# Patient Record
Sex: Female | Born: 1990 | Race: White | Hispanic: No | Marital: Single | Smoking: Former smoker
Health system: Southern US, Community
[De-identification: ages and names within clinical notes are randomized; demographics above are authoritative.]

## PROBLEM LIST (undated history)

## (undated) ENCOUNTER — Inpatient Hospital Stay (HOSPITAL_COMMUNITY): Payer: Self-pay

## (undated) DIAGNOSIS — F329 Major depressive disorder, single episode, unspecified: Secondary | ICD-10-CM

## (undated) DIAGNOSIS — R51 Headache: Secondary | ICD-10-CM

## (undated) DIAGNOSIS — B999 Unspecified infectious disease: Secondary | ICD-10-CM

## (undated) DIAGNOSIS — F32A Depression, unspecified: Secondary | ICD-10-CM

## (undated) DIAGNOSIS — F4310 Post-traumatic stress disorder, unspecified: Secondary | ICD-10-CM

## (undated) DIAGNOSIS — D649 Anemia, unspecified: Secondary | ICD-10-CM

## (undated) HISTORY — DX: Post-traumatic stress disorder, unspecified: F43.10

## (undated) HISTORY — PX: TONSILLECTOMY: SUR1361

## (undated) HISTORY — DX: Depression, unspecified: F32.A

## (undated) HISTORY — PX: WISDOM TOOTH EXTRACTION: SHX21

## (undated) HISTORY — DX: Major depressive disorder, single episode, unspecified: F32.9

## (undated) HISTORY — PX: TYMPANOSTOMY TUBE PLACEMENT: SHX32

---

## 2011-09-01 ENCOUNTER — Emergency Department (HOSPITAL_COMMUNITY)
Admission: EM | Admit: 2011-09-01 | Discharge: 2011-09-01 | Disposition: A | Discharge status: Home / Self Care | Payer: Self-pay | Attending: Emergency Medicine | Admitting: Emergency Medicine

## 2011-09-01 ENCOUNTER — Encounter: Payer: Self-pay | Admitting: *Deleted

## 2011-09-01 ENCOUNTER — Emergency Department (HOSPITAL_COMMUNITY): Payer: Self-pay

## 2011-09-01 DIAGNOSIS — M549 Dorsalgia, unspecified: Secondary | ICD-10-CM | POA: Insufficient documentation

## 2011-09-01 DIAGNOSIS — O99891 Other specified diseases and conditions complicating pregnancy: Secondary | ICD-10-CM | POA: Insufficient documentation

## 2011-09-01 DIAGNOSIS — R1032 Left lower quadrant pain: Secondary | ICD-10-CM | POA: Insufficient documentation

## 2011-09-01 DIAGNOSIS — O99340 Other mental disorders complicating pregnancy, unspecified trimester: Secondary | ICD-10-CM | POA: Insufficient documentation

## 2011-09-01 DIAGNOSIS — F329 Major depressive disorder, single episode, unspecified: Secondary | ICD-10-CM

## 2011-09-01 DIAGNOSIS — F3289 Other specified depressive episodes: Secondary | ICD-10-CM | POA: Insufficient documentation

## 2011-09-01 LAB — URINALYSIS, ROUTINE W REFLEX MICROSCOPIC
Hgb urine dipstick: NEGATIVE
Nitrite: NEGATIVE
Specific Gravity, Urine: 1.018 (ref 1.005–1.030)
Urobilinogen, UA: 1 mg/dL (ref 0.0–1.0)
pH: 8 (ref 5.0–8.0)

## 2011-09-01 LAB — CBC
Platelets: 248 10*3/uL (ref 150–400)
RBC: 4.22 MIL/uL (ref 3.87–5.11)
RDW: 12.8 % (ref 11.5–15.5)
WBC: 7.4 10*3/uL (ref 4.0–10.5)

## 2011-09-01 LAB — RAPID URINE DRUG SCREEN, HOSP PERFORMED
Barbiturates: NOT DETECTED
Cocaine: NOT DETECTED
Tetrahydrocannabinol: POSITIVE — AB

## 2011-09-01 LAB — COMPREHENSIVE METABOLIC PANEL
ALT: 11 U/L (ref 0–35)
AST: 13 U/L (ref 0–37)
Alkaline Phosphatase: 43 U/L (ref 39–117)
CO2: 23 mEq/L (ref 19–32)
Chloride: 102 mEq/L (ref 96–112)
GFR calc non Af Amer: >90 mL/min (ref >90)
Potassium: 4.5 mEq/L (ref 3.5–5.1)
Sodium: 134 mEq/L — ABNORMAL LOW (ref 135–145)
Total Bilirubin: 0.3 mg/dL (ref 0.3–1.2)

## 2011-09-01 LAB — POCT PREGNANCY, URINE: Preg Test, Ur: POSITIVE

## 2011-09-01 LAB — DIFFERENTIAL
Basophils Absolute: 0 10*3/uL (ref 0.0–0.1)
Lymphocytes Relative: 23 % (ref 12–46)
Neutro Abs: 5.2 10*3/uL (ref 1.7–7.7)

## 2011-09-01 LAB — URINE MICROSCOPIC-ADD ON

## 2011-09-01 LAB — WET PREP, GENITAL: Trich, Wet Prep: NONE SEEN

## 2011-09-01 MED ORDER — NICOTINE 21 MG/24HR TD PT24: 21.0000 mg | MEDICATED_PATCH | Freq: Every day | TRANSDERMAL | Status: DC

## 2011-09-01 MED ORDER — ACETAMINOPHEN 325 MG PO TABS: 650.0000 mg | ORAL_TABLET | ORAL | Status: DC | PRN

## 2011-09-01 MED ORDER — SODIUM CHLORIDE 0.9 % IV SOLN
Freq: Once | INTRAVENOUS | Status: AC
  Administered 2011-09-01: 125 mL/h via INTRAVENOUS
  Administered 2011-09-01: 125 mL via INTRAVENOUS

## 2011-09-01 MED ORDER — ONDANSETRON HCL 4 MG PO TABS: 4.0000 mg | ORAL_TABLET | Freq: Three times a day (TID) | ORAL | Status: DC | PRN

## 2011-09-01 MED ORDER — ONDANSETRON HCL 4 MG/2ML IJ SOLN
4.0000 mg | INTRAMUSCULAR | Status: DC | PRN
  Administered 2011-09-01: 4 mg via INTRAVENOUS
  Filled 2011-09-01: qty 2

## 2011-09-01 MED ORDER — SODIUM CHLORIDE 0.9 % IV BOLUS (SEPSIS)
1000.0000 mL | Freq: Once | INTRAVENOUS | Status: AC
  Administered 2011-09-01: 1000 mL via INTRAVENOUS

## 2011-09-01 MED ORDER — SODIUM CHLORIDE 0.9 % IV BOLUS (SEPSIS): 1000.0000 mL | Freq: Once | INTRAVENOUS | Status: DC

## 2011-09-01 NOTE — ED Notes (Signed)
Pt received a telepsych consult from Dr. Jacky Kindle which recommended d/c home with outpatient referrals. EDP notified and is in agreement with this disposition. RN made aware. Pt is agreeable with the EDP and psychiatrist's disposition to follow up with outpatient services. CSW reviewed outpatient resources with pt including [facility] and mobile crisis. CSW reviewed the benefits of therapy and pt agreed to follow up with [facility] on 09/02/11. No further CSW needs identified at this time.

## 2011-09-01 NOTE — ED Provider Notes (Signed)
History     CSN: [id number] Arrival date & time: 09/01/2011  8:07 AM   None     Chief Complaint  Patient presents with  . Back Pain  . Abdominal Pain    (Consider location/radiation/quality/duration/timing/severity/associated sxs/prior treatment) HPI  History reviewed. No pertinent past medical history.  Past Surgical History  Procedure Date  . Tonsillectomy     No family history on file.  History  Substance Use Topics  . Smoking status: Former Games developer  . Smokeless tobacco: Not on file  . Alcohol Use: No    OB History    Grav Para Term Preterm Abortions TAB SAB Ect Mult Living                  Review of Systems  Allergies  Review of patient's allergies indicates no known allergies.  Home Medications  No current outpatient prescriptions on file.  BP 115/66  Pulse 99  Temp 98.9 F (37.2 C)  Resp 18  SpO2 98%  LMP 07/13/2011  Physical Exam  ED Course  Procedures (including critical care time)   Labs Reviewed  URINALYSIS, ROUTINE W REFLEX MICROSCOPIC  POCT PREGNANCY, URINE   No results found.   No diagnosis found.    MDM  Not my patient        Doug Sou, MD 09/01/11 [phone]

## 2011-09-01 NOTE — ED Notes (Signed)
Sopcial Worker Elania here and states she has been assessing pt. And discussed her finds with Dr. Marcelyn Bruins. Will be discharged home.

## 2011-09-01 NOTE — ED Notes (Signed)
Expressing hunger but, also states I feel nauseated---Fresh Ginger-ale given to pt.

## 2011-09-01 NOTE — ED Notes (Signed)
Dr. Lynelle Doctor in for pelvic

## 2011-09-01 NOTE — ED Notes (Signed)
C/o left lower quadrant pain radiating into low back--onset two weeks ago with gradual worsening--tearful--states she is about 11 weeks IUP--no bleeding

## 2011-09-01 NOTE — ED Provider Notes (Signed)
6:57 PM Patient has been seen by the patella, psychiatrist. There was no evidence of delusional thought to her cognitive impairment. Patient had no plan or intent to harm herself and they felt she would benefit from outpatient counseling. The patient is amenable to this. They're recommending discharge home.  Teshawn Moan A. Patrica Duel, MD 09/01/11 [id number]

## 2011-09-01 NOTE — ED Provider Notes (Signed)
History     CSN: [id number] Arrival date & time: 09/01/2011  8:07 AM   First MD Initiated Contact with Patient 09/01/11 [phone]      Chief Complaint  Patient presents with  . Back Pain  . Abdominal Pain    (Consider location/radiation/quality/duration/timing/severity/associated sxs/prior treatment) HPI  Patient relates she is G2 P1 Ab0, last normal period the first part of September. She states she is approximately [redacted] weeks pregnant. She states she had a normal first pregnancy. She states she's been seen for back pain and abdominal pain at [facility] about 2 weeks ago. She states they told her "you're stressed out go home and take a nap" she states she's having nausea and vomiting at least 15 times a day with diarrhea at least 15 times a day with loose and watery. She states about 4 days ago she had fever up to 1.4. She has dysuria for pressure feeling after she urinates, she is having frequency, she states she feels dizzy when she sits up or stands up. She denies coughing or sore throat, no vaginal bleeding,. Patient states she has a history of depression and she had postpartum depression after her first child who is 27 months old. She states she was not hospitalized. She states she stopped taking the medicine because it made her angry. Patient states she's under a lot of stress and then burst out crying. She states she's working and her baby's father does not work and she has to support him. States she likes data mental health counselor what she is feeling improved in the ER.  GYN Dr. Dr. Alyce Pagan and [facility]   History reviewed. No pertinent past medical history. Depression Postpartum depression    Past Surgical History  Procedure Date  . Tonsillectomy     No family history on file.  History  Substance Use Topics  . Smoking status: Former Games developer  . Smokeless tobacco: Not on file  . Alcohol Use: No   employed  OB History    Grav Para Term Preterm Abortions TAB SAB  Ect Mult Living                  Review of Systems  All other systems reviewed and are negative.    Allergies  Review of patient's allergies indicates no known allergies.  Home Medications   Current Outpatient Rx  Name Route Sig Dispense Refill  . ACETAMINOPHEN 500 MG PO TABS Oral Take 1,000 mg by mouth every 6 (six) hours as needed. For pain       BP 110/61  Pulse 71  Temp(Src) 98.1 F (36.7 C) (Oral)  Resp 18  SpO2 100%  LMP 07/13/2011 Vital signs normal  Physical Exam  Nursing note and vitals reviewed. Constitutional: She is oriented to person, place, and time. She appears well-developed and well-nourished.  Non-toxic appearance. She does not appear ill. She appears distressed.  HENT:  Head: Normocephalic and atraumatic.  Right Ear: External ear normal.  Left Ear: External ear normal.  Nose: Nose normal. No mucosal edema or rhinorrhea.  Mouth/Throat: Mucous membranes are normal. No dental abscesses or uvula swelling.       Mucous membranes are dry  Eyes: Conjunctivae and EOM are normal. Pupils are equal, round, and reactive to light.       Cries with tears  Neck: Normal range of motion and full passive range of motion without pain. Neck supple.  Cardiovascular: Normal rate, regular rhythm and normal heart sounds.  Exam  reveals no gallop and no friction rub.   No murmur heard. Pulmonary/Chest: Effort normal and breath sounds normal. No respiratory distress. She has no wheezes. She has no rhonchi. She has no rales. She exhibits no tenderness and no crepitus.  Abdominal: Soft. Normal appearance and bowel sounds are normal. She exhibits no distension. There is no tenderness. There is no rebound and no guarding.  Genitourinary: Vaginal discharge found.       Patient has normal external genitalia. She is a lot of thick white discharge in her vault. Her uterus does not feel very enlarged. Her left adnexa is tender without mass., Her right adnexa is nontender. Her uterus is  nontender no blood was seen  Musculoskeletal: Normal range of motion. She exhibits no edema and no tenderness.       Moves all extremities well.   Neurological: She is alert and oriented to person, place, and time. She has normal strength. No cranial nerve deficit.  Skin: Skin is warm, dry and intact. No rash noted. No erythema. No pallor.  Psychiatric: Her speech is normal and behavior is normal. Her mood appears not anxious.       Patient is very flat affect she is crying during her exam.    ED Course Patient  is receiving IV fluids, IV Zofran and oral Imodium for vomiting or diarrhea. Pt is still tearful at times and wants to have a behavioral health assessment.  13:48 [place], ACT will come evaluate patient.   14:45 patient requesting to eat per nursing staff, will try oral fluids.  And crackers. Pt states she hasn't had any diarrhea since she has been in the ED, she states she has vomited twice.   [place] bedside Date/Time: 09/01/2011 10:33 AM Performed by: Lynelle Doctor, Ashya Nicolaisen L Authorized by: Ward Givens Consent: Verbal consent obtained. Consent given by: patient Patient understanding: patient states understanding of the procedure being performed Patient identity confirmed: verbally with patient, arm band, provided demographic data and hospital-assigned identification number Time out: Immediately prior to procedure a "time out" was called to verify the correct patient, procedure, equipment, support staff and site/side marked as required. Local anesthesia used: no Patient sedated: no Patient tolerance: Patient tolerated the procedure well with no immediate complications. Comments: Pt has IUP approximately [redacted] weeks gestation with FHR of 162.  Views archieved When I did her ultrasound patient is now smiling and laughing.   (including critical care time)   Results for orders placed during the hospital encounter of 09/01/11  URINALYSIS, ROUTINE W REFLEX MICROSCOPIC      Component Value Range     Color, Urine YELLOW  YELLOW    APPearance CLOUDY (*) CLEAR    Specific Gravity, Urine 1.018  1.005 - 1.030    pH 8.0  5.0 - 8.0    Glucose, UA NEGATIVE  NEGATIVE (mg/dL)   Hgb urine dipstick NEGATIVE  NEGATIVE    Bilirubin Urine NEGATIVE  NEGATIVE    Ketones, ur 15 (*) NEGATIVE (mg/dL)   Protein, ur NEGATIVE  NEGATIVE (mg/dL)   Urobilinogen, UA 1.0  0.0 - 1.0 (mg/dL)   Nitrite NEGATIVE  NEGATIVE    Leukocytes, UA MODERATE (*) NEGATIVE   POCT PREGNANCY, URINE      Component Value Range   Preg Test, Ur POSITIVE    CBC      Component Value Range   WBC 7.4  4.0 - 10.5 (K/uL)   RBC 4.22  3.87 - 5.11 (MIL/uL)   Hemoglobin 13.2  12.0 -  15.0 (g/dL)   HCT 16.1  09.6 - 04.5 (%)   MCV 87.7  78.0 - 100.0 (fL)   MCH 31.3  26.0 - 34.0 (pg)   MCHC 35.7  30.0 - 36.0 (g/dL)   RDW 40.9  81.1 - 91.4 (%)   Platelets 248  150 - 400 (K/uL)  DIFFERENTIAL      Component Value Range   Neutrophils Relative 70  43 - 77 (%)   Neutro Abs 5.2  1.7 - 7.7 (K/uL)   Lymphocytes Relative 23  12 - 46 (%)   Lymphs Abs 1.7  0.7 - 4.0 (K/uL)   Monocytes Relative 6  3 - 12 (%)   Monocytes Absolute 0.4  0.1 - 1.0 (K/uL)   Eosinophils Relative 1  0 - 5 (%)   Eosinophils Absolute 0.1  0.0 - 0.7 (K/uL)   Basophils Relative 0  0 - 1 (%)   Basophils Absolute 0.0  0.0 - 0.1 (K/uL)  COMPREHENSIVE METABOLIC PANEL      Component Value Range   Sodium 134 (*) 135 - 145 (mEq/L)   Potassium 4.5  3.5 - 5.1 (mEq/L)   Chloride 102  96 - 112 (mEq/L)   CO2 23  19 - 32 (mEq/L)   Glucose, Bld 83  70 - 99 (mg/dL)   BUN 6  6 - 23 (mg/dL)   Creatinine, Ser 7.82 (*) 0.50 - 1.10 (mg/dL)   Calcium 9.3  8.4 - 95.6 (mg/dL)   Total Protein 7.2  6.0 - 8.3 (g/dL)   Albumin 3.7  3.5 - 5.2 (g/dL)   AST 13  0 - 37 (U/L)   ALT 11  0 - 35 (U/L)   Alkaline Phosphatase 43  39 - 117 (U/L)   Total Bilirubin 0.3  0.3 - 1.2 (mg/dL)   GFR calc non Af Amer >90  >90 (mL/min)   GFR calc Af Amer >90  >90 (mL/min)  HCG, QUANTITATIVE, PREGNANCY       Component Value Range   hCG, Beta Chain, Quant, S 21308 (*) <5 (mIU/mL)  WET PREP, GENITAL      Component Value Range   Yeast, Wet Prep NONE SEEN  NONE SEEN    Trich, Wet Prep NONE SEEN  NONE SEEN    Clue Cells, Wet Prep FEW (*) NONE SEEN    WBC, Wet Prep HPF POC MANY (*) NONE SEEN   URINE MICROSCOPIC-ADD ON      Component Value Range   Squamous Epithelial / LPF FEW (*) RARE    WBC, UA 7-10  <3 (WBC/hpf)   Bacteria, UA FEW (*) RARE    Urine-Other MUCOUS PRESENT    ETHANOL      Component Value Range   Alcohol, Ethyl (B) <11  0 - 11 (mg/dL)    Laboratory Interpretation possible UTI, bacterial vaginosis  US Ob Comp Less 14 Wks  09/01/2011  *RADIOLOGY REPORT*  Clinical Data: Left lower quadrant pain.  OBSTETRIC <14 WK [place] AND TRANSVAGINAL OB US  Technique:  Both transabdominal and transvaginal ultrasound examinations were performed for complete evaluation of the gestation as well as the maternal uterus, adnexal regions, and pelvic cul-de-sac.  Transvaginal technique was performed to assess early pregnancy.  Comparison:  None.  Intrauterine gestational sac:  Visualized/normal in shape. Yolk sac: Visualized Embryo: Visualized Cardiac Activity: Visualized Heart Rate: 165 bpm  MSD:   mm      w     d CRL: 55.3   mm  12  w  1   d             [place] EDC: 03/14/2012  Maternal uterus/adnexae: The left ovary appears normal measuring 1.5 x 2.2 x 1.9 cm.  The right ovary appears normal measuring 1.8 x 2.2 x 2.9 cm and containing a corpus luteum.  No pelvic fluid or separate adnexal masses are seen.  IMPRESSION: Single living intrauterine pregnancy demonstrating an estimated gestational age by crown-rump length of 12 weeks 1 day.  This is 1 week behind expected estimated gestational age by LMP of 13 weeks 1 day.  Normal ovaries.  Original Report Authenticated By: Bertha Stakes, M.D.   US Ob Transvaginal  09/01/2011  *RADIOLOGY REPORT*  Clinical Data: Left lower quadrant pain.  OBSTETRIC <14 WK [place] AND  TRANSVAGINAL OB US  Technique:  Both transabdominal and transvaginal ultrasound examinations were performed for complete evaluation of the gestation as well as the maternal uterus, adnexal regions, and pelvic cul-de-sac.  Transvaginal technique was performed to assess early pregnancy.  Comparison:  None.  Intrauterine gestational sac:  Visualized/normal in shape. Yolk sac: Visualized Embryo: Visualized Cardiac Activity: Visualized Heart Rate: 165 bpm  MSD:   mm      w     d CRL: 55.3   mm  12   w  1   d             [place] EDC: 03/14/2012  Maternal uterus/adnexae: The left ovary appears normal measuring 1.5 x 2.2 x 1.9 cm.  The right ovary appears normal measuring 1.8 x 2.2 x 2.9 cm and containing a corpus luteum.  No pelvic fluid or separate adnexal masses are seen.  IMPRESSION: Single living intrauterine pregnancy demonstrating an estimated gestational age by crown-rump length of 12 weeks 1 day.  This is 1 week behind expected estimated gestational age by LMP of 13 weeks 1 day.  Normal ovaries.  Original Report Authenticated By: Bertha Stakes, M.D.    Diagnoses that have been ruled out:  Diagnoses that are still under consideration:  Final diagnoses:  Depression  Pregnancy  Disposition per Dr Denton Brick after ACT evaluation  Devoria Albe, MD, FACEP        MDM          Ward Givens, MD 09/01/11 2029

## 2011-09-01 NOTE — ED Notes (Signed)
Social Worker in to see pt.

## 2011-09-01 NOTE — ED Notes (Signed)
Information has been faxed for a psych-consult needed for further disposition recommendations.

## 2011-09-01 NOTE — BH Assessment (Signed)
Assessment Note   Ana Mejia is an 20 y.o. female. Patient represented to the ED with feelings and reports of stress and exhaustion.   Patient states she has a 21 year old and is pregnant again (11 weeks). Patient reports being stressed from working over 70 hours per week and constant battles with her probation officer who patient feels does not want her to be successful. Patient states she was in the hospital a few months ago, and her probation officer took warrant out for her arrest, when she was discharge, despite the fact she had documentation showing inpatient treatment.    Patient states her employer is very supported, despite her criminal charges, and wanted to send her back to school and pay for it.  Patient states her probation officer blocked this opportunity, stating that patient could not because she is on curfew from 7pm - 7am.  Patient also reports that she cannot leave [facility], her mother lives in [place] and she has no support system here.  Despite that, she does not have a positive relationship with her mother who she reports does not have a positive lifestyle.   Her boyfriend and the father of her daughter and pending child is 32 yrs old, does not work and does not wish to work, and she refuses to allow him to stay at her home. Patient states that she will not take care of an adult female.  Patient reports having 2 1/2 years left on probation. Patient denies SI/HI/AVH, however remained very tearful during assessment. Patient unable to respond to "magical question" (What would make everything good).  This Clinical research associate has requested psychiatrist to evaluate patient for disposition.  Axis I: Depressive Disorder NOS Axis II: Deferred Axis III: History reviewed. No pertinent past medical history. Axis IV: problems related to legal system/crime, problems with access to health care services and problems with primary support group Axis V: 51-60 moderate symptoms  Past Medical  History: History reviewed. No pertinent past medical history.  Past Surgical History  Procedure Date  . Tonsillectomy     Family History: No family history on file.  Social History:  reports that she has quit smoking. She does not have any smokeless tobacco history on file. She reports that she does not drink alcohol or use illicit drugs.  Additional Social History:    Allergies: No Known Allergies  Home Medications:  Medications Prior to Admission  Medication Dose Route Frequency Provider Last Rate Last Dose  . 0.9 %  sodium chloride infusion   Intravenous Once Ward Givens, MD 125 mL/hr at 09/01/11 1107 125 mL at 09/01/11 1107  . ondansetron (ZOFRAN) injection 4 mg  4 mg Intravenous Q30 min PRN Ward Givens, MD   4 mg at 09/01/11 1108  . sodium chloride 0.9 % bolus 1,000 mL  1,000 mL Intravenous Once Ward Givens, MD   1,000 mL at 09/01/11 1108   No current outpatient prescriptions on file as of 09/01/2011.    OB/GYN Status:  Patient's last menstrual period was 07/13/2011.  General Assessment Data Assessment Number: 1  Living Arrangements: Children Can pt return to current living arrangement?: Yes Admission Status: Voluntary Is patient capable of signing voluntary admission?: Yes Transfer from: Acute Hospital Referral Source: Self/Family/Friend     Risk to self Suicidal Ideation: No Suicidal Intent: No Is patient at risk for suicide?: No Suicidal Plan?: No Access to Means: No What has been your use of drugs/alcohol within the last 12 months?: None  Previous Attempts/Gestures: No Intentional Self Injurious Behavior: None Family Suicide History: No Recent stressful life event(s): Legal Issues Persecutory voices/beliefs?: No Depression: Yes Depression Symptoms: Tearfulness;Loss of interest in usual pleasures;Isolating Substance abuse history and/or treatment for substance abuse?: No Suicide prevention information given to non-admitted patients: Not applicable  Risk to  Others Homicidal Ideation: No Thoughts of Harm to Others: No Current Homicidal Intent: No Current Homicidal Plan: No Access to Homicidal Means: No History of harm to others?: No Assessment of Violence: None Noted Does patient have access to weapons?: No Criminal Charges Pending?: No Does patient have a court date: No  Psychosis Hallucinations: None noted Delusions: None noted  Mental Status Report Appear/Hygiene: Improved Eye Contact: Fair Motor Activity: Unremarkable Speech: Logical/coherent Level of Consciousness: Crying;Alert Mood: Depressed;Ashamed/humiliated Affect: Sad;Depressed Anxiety Level: Moderate Thought Processes: Coherent;Tangential Judgement: Unimpaired Orientation: Person;Place;Time;Situation Obsessive Compulsive Thoughts/Behaviors: Minimal  Cognitive Functioning Concentration: Decreased Memory: Recent Intact;Remote Intact IQ: Average Insight: Fair Impulse Control: Fair Appetite: Poor Weight Loss: 5  Sleep: Decreased Total Hours of Sleep: 4  Vegetative Symptoms: None  Prior Inpatient Therapy Prior Inpatient Therapy: No  Prior Outpatient Therapy Prior Outpatient Therapy: Yes Prior Therapy Dates: unknown Prior Therapy Facilty/Provider(s): Unknown Reason for Treatment: Post Partum Depression            Values / Beliefs Cultural Requests During Hospitalization: None Spiritual Requests During Hospitalization: None        Additional Information 1:1 In Past 12 Months?: No CIRT Risk: No Elopement Risk: No Does patient have medical clearance?: Yes     Disposition:  Disposition Disposition of Patient: Other dispositions (Pending) Other disposition(s): Other (Comment)  On Site Evaluation by:   Reviewed with Physician:     Ileene Hutchinson 09/01/2011 2:39 PM

## 2011-09-01 NOTE — ED Notes (Signed)
Pt reports being approx [redacted] weeks pregnant. Reports generalized body aches, diarrhea, LLQ abd pain, dizziness. Denies vaginal bleeding.

## 2011-09-02 LAB — GC/CHLAMYDIA PROBE AMP, GENITAL
Chlamydia, DNA Probe: NEGATIVE
GC Probe Amp, Genital: NEGATIVE

## 2011-09-27 NOTE — L&D Delivery Note (Signed)
Delivery Note At 2:36 AM a viable and healthy female was delivered via Vaginal, Spontaneous Delivery (Presentation: ROA ).  APGAR 8,9: pending; Meconium stained amniotic fluid, infant vigorous at birth, placed on mother's abdomen at delivery; weight pending.   Placenta status: Intact, Spontaneous.  Cord: 3 vessels with the following complications: None.    Anesthesia: Epidural  Episiotomy: None Lacerations: None Suture Repair: N/A Est. Blood Loss (mL): 250  Mom to postpartum.  Baby to nursery-stable.  LEFTWICH-KIRBY, Tequisha Maahs 03/24/2012, 2:52 AM

## 2011-09-27 NOTE — L&D Delivery Note (Signed)
I have seen @PTNAME @ and examination done.  I agree with documentation and plan as noted in resident/PA's note. Silviano Neuser V 03/31/2012 5:57 AM

## 2011-11-24 ENCOUNTER — Inpatient Hospital Stay (HOSPITAL_COMMUNITY): Payer: Self-pay

## 2011-11-24 ENCOUNTER — Encounter (HOSPITAL_COMMUNITY): Payer: Self-pay

## 2011-11-24 ENCOUNTER — Inpatient Hospital Stay (HOSPITAL_COMMUNITY)
Admission: AD | Admit: 2011-11-24 | Discharge: 2011-11-24 | Disposition: A | Discharge status: Home / Self Care | Payer: Self-pay | Source: Ambulatory Visit | Attending: Obstetrics & Gynecology | Admitting: Obstetrics & Gynecology

## 2011-11-24 DIAGNOSIS — R109 Unspecified abdominal pain: Secondary | ICD-10-CM | POA: Insufficient documentation

## 2011-11-24 DIAGNOSIS — O358XX0 Maternal care for other (suspected) fetal abnormality and damage, not applicable or unspecified: Secondary | ICD-10-CM | POA: Insufficient documentation

## 2011-11-24 DIAGNOSIS — O99891 Other specified diseases and conditions complicating pregnancy: Secondary | ICD-10-CM | POA: Insufficient documentation

## 2011-11-24 DIAGNOSIS — K219 Gastro-esophageal reflux disease without esophagitis: Secondary | ICD-10-CM

## 2011-11-24 DIAGNOSIS — N949 Unspecified condition associated with female genital organs and menstrual cycle: Secondary | ICD-10-CM

## 2011-11-24 DIAGNOSIS — Z3480 Encounter for supervision of other normal pregnancy, unspecified trimester: Secondary | ICD-10-CM

## 2011-11-24 HISTORY — DX: Headache: R51

## 2011-11-24 LAB — WET PREP, GENITAL
Clue Cells Wet Prep HPF POC: NONE SEEN
Trich, Wet Prep: NONE SEEN

## 2011-11-24 LAB — DIFFERENTIAL
Basophils Absolute: 0 10*3/uL (ref 0.0–0.1)
Basophils Relative: 0 % (ref 0–1)
Lymphocytes Relative: 19 % (ref 12–46)
Monocytes Relative: 5 % (ref 3–12)
Neutro Abs: 8.2 10*3/uL — ABNORMAL HIGH (ref 1.7–7.7)
Neutrophils Relative %: 76 % (ref 43–77)

## 2011-11-24 LAB — URINALYSIS, ROUTINE W REFLEX MICROSCOPIC
Glucose, UA: NEGATIVE mg/dL
Ketones, ur: 15 mg/dL — AB
Leukocytes, UA: NEGATIVE
Nitrite: NEGATIVE
Specific Gravity, Urine: 1.015 (ref 1.005–1.030)
pH: 7 (ref 5.0–8.0)

## 2011-11-24 LAB — CBC
Hemoglobin: 12.5 g/dL (ref 12.0–15.0)
MCHC: 34.5 g/dL (ref 30.0–36.0)
WBC: 10.8 10*3/uL — ABNORMAL HIGH (ref 4.0–10.5)

## 2011-11-24 MED ORDER — IBUPROFEN 600 MG PO TABS
600.0000 mg | ORAL_TABLET | Freq: Once | ORAL | Status: AC
  Administered 2011-11-24: 600 mg via ORAL
  Filled 2011-11-24: qty 1

## 2011-11-24 MED ORDER — PROMETHAZINE HCL 12.5 MG PO TABS: 25.0000 mg | ORAL_TABLET | Freq: Four times a day (QID) | ORAL | Status: DC | PRN

## 2011-11-24 MED ORDER — ONDANSETRON HCL 8 MG PO TABS: 8.0000 mg | ORAL_TABLET | Freq: Three times a day (TID) | ORAL | Status: AC | PRN

## 2011-11-24 NOTE — Discharge Instructions (Signed)
Prenatal Care [facility] OB/GYN    [facility] OB/GYN  & Infertility  [phone]     Phone: [phone]          [facility]                      [facility]  @[place]     Phone: [phone]  Phone: [phone]         [facility]     Phone: [phone]  Phone: [phone]           [facility] @ [place]                hone: [phone]  Phone: [phone]         [facility] Dr. Francoise Ceo      Phone: [phone]  Phone: [phone]         [facility]                Phone: [phone]  [facility]   Phone:[phone]    Family [address] [place])          Phone: [phone] [facility]   Phone: [phone] Diet for GERD or PUD Nutrition therapy can help ease the discomfort of gastroesophageal reflux disease (GERD) and peptic ulcer disease (PUD).  HOME CARE INSTRUCTIONS   Eat your meals slowly, in a relaxed setting.   Eat 5 to 6 small meals per day.   If a food causes distress, stop eating it for a period of time.  FOODS TO AVOID  Coffee, regular or decaffeinated.   Cola beverages, regular or low calorie.   Tea, regular or decaffeinated.   Pepper.   Cocoa.   High fat foods, including meats.   Butter, margarine, hydrogenated oil (trans fats).   Peppermint or spearmint (if you have GERD).   Fruits and vegetables if not tolerated.   Alcohol.   Nicotine (smoking or chewing). This is one of the most potent stimulants to acid production in the gastrointestinal tract.   Any food that seems to aggravate your condition.  If you have questions regarding your diet, ask your caregiver or a registered dietitian. TIPS  Lying flat may make symptoms worse. Keep the head of your bed raised 6 to 9 inches (15 to 23 cm) by using a foam wedge or blocks under the legs of the bed.     Do not lay down until 3 hours after eating a meal.   Daily physical activity may help reduce symptoms.  MAKE SURE YOU:   Understand these instructions.   Will watch your condition.   Will get help right away if you are not doing well or get worse.  Document Released: 09/12/2005 Document Revised: 05/25/2011 Document Reviewed: 01/26/2009 [facility] Patient Information 2012 [place], [place].Diet for GERD or PUD Nutrition therapy can help ease the discomfort of gastroesophageal reflux disease (GERD) and peptic ulcer disease (PUD).  HOME CARE INSTRUCTIONS   Eat your meals slowly, in a relaxed setting.   Eat 5 to 6 small meals per day.   If a food causes distress, stop eating it for a period of time.  FOODS TO AVOID  Coffee, regular or decaffeinated.   Cola beverages, regular or low calorie.   Tea, regular or decaffeinated.   Pepper.   Cocoa.   High fat foods, including meats.  Butter, margarine, hydrogenated oil (trans fats).   Peppermint or spearmint (if you have GERD).   Fruits and vegetables if not tolerated.   Alcohol.   Nicotine (smoking or chewing). This is one of the most potent stimulants to acid production in the gastrointestinal tract.   Any food that seems to aggravate your condition.  If you have questions regarding your diet, ask your caregiver or a registered dietitian. TIPS  Lying flat may make symptoms worse. Keep the head of your bed raised 6 to 9 inches (15 to 23 cm) by using a foam wedge or blocks under the legs of the bed.   Do not lay down until 3 hours after eating a meal.   Daily physical activity may help reduce symptoms.  MAKE SURE YOU:   Understand these instructions.   Will watch your condition.   Will get help right away if you are not doing well or get worse.  Document Released: 09/12/2005 Document Revised: 05/25/2011 Document Reviewed: 01/26/2009 [facility] Patient Information 2012 [place], [place].

## 2011-11-24 NOTE — Progress Notes (Signed)
Patient states she had been told by [facility] that she had an ectopic pregnancy on 11-8. Patient states she has had no prenatal care and started having lower abdominal pain last night that feels like contractions. Had a little spotting last night but none today. Patient states she has not felt any fetal movement.

## 2011-11-24 NOTE — ED Provider Notes (Signed)
History     Chief Complaint  Patient presents with  . Abdominal Pain   HPI Ana Mejia is 21 y.o. G2P1001 Unknown weeks presenting with abdominal pain, associated with nausea and vomiting that began last night. She has had several episodes of diarrhea today.  Has not felt well the last week.  Occ heartburn.  Chill all night and day, shakey.  Denies fever.   Was seen early in the pregnancy at [facility] and thought they told her it was an ectopic pregnancy, had followup.  When I asked if the u/s saw anything, she said no..  Ectopic precautions were given at time of discharge per her report.  Was seeing Dr. Rodell Perna in [facility] but lost insurance so they will not see her per her report.  Has not had prenatal care since December.   Medicaid pending.  Plans prenatal care in [facility].      Past Medical History  Diagnosis Date  . Headache     Past Surgical History  Procedure Date  . Tonsillectomy   . Wisdom tooth extraction   . Tympanostomy tube placement     x3    No family history on file.  History  Substance Use Topics  . Smoking status: Former Games developer  . Smokeless tobacco: Not on file  . Alcohol Use: No    Allergies: No Known Allergies  Prescriptions prior to admission  Medication Sig Dispense Refill  . acetaminophen (TYLENOL) 500 MG tablet Take 1,000 mg by mouth every 6 (six) hours as needed. For pain         ROS: Otherwise neg Physical Exam   Blood pressure 118/79, pulse 72, temperature 97.3 F (36.3 C), temperature source Oral, resp. rate 20, height 5\' 4"  (1.626 m), weight 73.211 kg (161 lb 6.4 oz), last menstrual period 07/13/2011, SpO2 99.00%.  Physical Exam  Constitutional: She is oriented to person, place, and time. She appears well-developed and well-nourished.  HENT:  Head: Normocephalic.  Cardiovascular: Normal rate.   Respiratory: Effort normal.  GI: There is no rebound and no guarding.       gravid  Genitourinary: Uterus is enlarged (gravid fundus just  above the umibilicus) and tender (mild). No bleeding around the vagina. Vaginal discharge (small amount of white discharge without odor) found.       Cervix is closed  Neurological: She is alert and oriented to person, place, and time.  Skin: Skin is warm and dry.  Psychiatric: Her behavior is normal.   Recent Results (from the past 336 hour(s))  URINALYSIS, ROUTINE W REFLEX MICROSCOPIC   Collection Time   11/24/11  2:25 PM      Component Value Range   Color, Urine AMBER (*) YELLOW    APPearance CLEAR  CLEAR    Specific Gravity, Urine 1.015  1.005 - 1.030    pH 7.0  5.0 - 8.0    Glucose, UA NEGATIVE  NEGATIVE (mg/dL)   Hgb urine dipstick NEGATIVE  NEGATIVE    Bilirubin Urine NEGATIVE  NEGATIVE    Ketones, ur 15 (*) NEGATIVE (mg/dL)   Protein, ur NEGATIVE  NEGATIVE (mg/dL)   Urobilinogen, UA 1.0  0.0 - 1.0 (mg/dL)   Nitrite NEGATIVE  NEGATIVE    Leukocytes, UA NEGATIVE  NEGATIVE   GC/CHLAMYDIA PROBE AMP, GENITAL   Collection Time   11/24/11  3:05 PM      Component Value Range   GC Probe Amp, Genital NEGATIVE  NEGATIVE    Chlamydia, DNA Probe NEGATIVE  NEGATIVE   CBC   Collection Time   11/24/11  3:10 PM      Component Value Range   WBC 10.8 (*) 4.0 - 10.5 (K/uL)   RBC 4.01  3.87 - 5.11 (MIL/uL)   Hemoglobin 12.5  12.0 - 15.0 (g/dL)   HCT 16.1  09.6 - 04.5 (%)   MCV 90.3  78.0 - 100.0 (fL)   MCH 31.2  26.0 - 34.0 (pg)   MCHC 34.5  30.0 - 36.0 (g/dL)   RDW 40.9  81.1 - 91.4 (%)   Platelets 219  150 - 400 (K/uL)  DIFFERENTIAL   Collection Time   11/24/11  3:10 PM      Component Value Range   Neutrophils Relative 76  43 - 77 (%)   Neutro Abs 8.2 (*) 1.7 - 7.7 (K/uL)   Lymphocytes Relative 19  12 - 46 (%)   Lymphs Abs 2.0  0.7 - 4.0 (K/uL)   Monocytes Relative 5  3 - 12 (%)   Monocytes Absolute 0.5  0.1 - 1.0 (K/uL)   Eosinophils Relative 1  0 - 5 (%)   Eosinophils Absolute 0.1  0.0 - 0.7 (K/uL)   Basophils Relative 0  0 - 1 (%)   Basophils Absolute 0.0  0.0 - 0.1 (K/uL)    WET PREP, GENITAL   Collection Time   11/24/11  3:10 PM      Component Value Range   Yeast Wet Prep HPF POC NONE SEEN  NONE SEEN    Trich, Wet Prep NONE SEEN  NONE SEEN    Clue Cells Wet Prep HPF POC NONE SEEN  NONE SEEN    WBC, Wet Prep HPF POC TOO NUMEROUS TO COUNT (*) NONE SEEN    MAU Course  Procedures  GC/CHL culture to lab  MDM Care turned over to V. Katrinka Blazing, CNM.  Assessment and Plan    KEY,EVE M 11/24/2011, 2:50 PM   Matt Holmes, NP 11/24/11 1646  Assumed care of pt at 1700   [place] 24.1 week SIUP, S=D. Cephalic, normal anatomy except right pyelectasis. Normal fluid. RVOT not visualized. CL 3 cm.  Good relief of pain w/ Ibuprofen. No N/V/D while in MAU. D/C home. Resume PNC PTL Precautions F/U US after 33 weeks to reassess fetal pyelectasis.  Dorathy Kinsman 11/24/2011 5:55 PM

## 2011-11-24 NOTE — ED Notes (Signed)
Pt reports having N/V x 2 days had some pink spotting last night. Reports having stomach pain with tightening. Has not had prenatal care.

## 2011-11-24 NOTE — Progress Notes (Signed)
Ivonne Andrew CNM notified that patient is asking when she will be able to go home. pts family member is not able to drive after dark and they are concerned with how late it is getting.

## 2011-12-05 NOTE — ED Provider Notes (Signed)
Medical Screening exam and patient care preformed by advanced practice provider.  Agree with the above management.  

## 2011-12-26 LAB — OB RESULTS CONSOLE GC/CHLAMYDIA: Chlamydia: NEGATIVE

## 2011-12-26 LAB — OB RESULTS CONSOLE HEPATITIS B SURFACE ANTIGEN: Hepatitis B Surface Ag: NEGATIVE

## 2011-12-26 LAB — OB RESULTS CONSOLE RPR: RPR: NONREACTIVE

## 2011-12-26 LAB — OB RESULTS CONSOLE RUBELLA ANTIBODY, IGM: Rubella: UNDETERMINED

## 2012-02-09 ENCOUNTER — Ambulatory Visit (HOSPITAL_COMMUNITY)
Admission: RE | Admit: 2012-02-09 | Discharge: 2012-02-09 | Disposition: A | Discharge status: Home / Self Care | Payer: Self-pay | Source: Ambulatory Visit | Attending: Physician Assistant | Admitting: Physician Assistant

## 2012-02-09 ENCOUNTER — Other Ambulatory Visit (HOSPITAL_COMMUNITY): Payer: Self-pay | Admitting: Physician Assistant

## 2012-02-09 DIAGNOSIS — Z3689 Encounter for other specified antenatal screening: Secondary | ICD-10-CM | POA: Insufficient documentation

## 2012-02-09 DIAGNOSIS — Z1389 Encounter for screening for other disorder: Secondary | ICD-10-CM

## 2012-02-09 DIAGNOSIS — O358XX0 Maternal care for other (suspected) fetal abnormality and damage, not applicable or unspecified: Secondary | ICD-10-CM | POA: Insufficient documentation

## 2012-02-29 ENCOUNTER — Encounter (HOSPITAL_COMMUNITY): Payer: Self-pay | Admitting: *Deleted

## 2012-02-29 ENCOUNTER — Inpatient Hospital Stay (HOSPITAL_COMMUNITY)
Admission: AD | Admit: 2012-02-29 | Discharge: 2012-02-29 | Disposition: A | Discharge status: Home / Self Care | Payer: Self-pay | Source: Ambulatory Visit | Attending: Obstetrics and Gynecology | Admitting: Obstetrics and Gynecology

## 2012-02-29 DIAGNOSIS — O479 False labor, unspecified: Secondary | ICD-10-CM

## 2012-02-29 DIAGNOSIS — O99891 Other specified diseases and conditions complicating pregnancy: Secondary | ICD-10-CM | POA: Insufficient documentation

## 2012-02-29 NOTE — Progress Notes (Signed)
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

## 2012-02-29 NOTE — Discharge Instructions (Signed)

## 2012-02-29 NOTE — MAU Note (Signed)
Pt states she  Was seen at the [facility]ath dept for a regular visit. After stating she felt wet frome time to time. pt states she had 2 tests done to see if she has ruptured. Pt states 1 test was neg and the other test they could not tell.

## 2012-02-29 NOTE — MAU Provider Note (Signed)
Chief Complaint:  No chief complaint on file.   None     HPI  Ana Mejia is a 21 y.o. G2P1001 at [redacted]w[redacted]d seen for routine PNV at [facility] today and reported wetness in underwear. No gush of fluid. No irritative vaginal discharge or antecedent intercourse. States one q-tip test for SROM was negative and the other uncertain. States cx was 2 cm and she was told to come here for further evaluation for possible SROM.  Denies contractions, vaginal bleeding. Good fetal movement.   Pregnancy Course: uncomplicated  Past Medical History: Past Medical History  Diagnosis Date  . Headache     Past Surgical History: Past Surgical History  Procedure Date  . Tonsillectomy   . Wisdom tooth extraction   . Tympanostomy tube placement     x3    Family History: History reviewed. No pertinent family history.  Social History: History  Substance Use Topics  . Smoking status: Former Smoker -- 1.0 packs/day    Quit date: 08/01/2011  . Smokeless tobacco: Not on file  . Alcohol Use: No    Allergies: No Known Allergies  Meds:  Prescriptions prior to admission  Medication Sig Dispense Refill  . acetaminophen (TYLENOL) 500 MG tablet Take 1,000 mg by mouth every 6 (six) hours as needed. For pain, headache      . promethazine (PHENERGAN) 12.5 MG tablet Take 2 tablets (25 mg total) by mouth every 6 (six) hours as needed for nausea.  30 tablet  0      Physical Exam  Last menstrual period 07/13/2011. GENERAL: Well-developed, well-nourished female in no acute distress.  HEENT: normocephalic, good dentition HEART: normal rate RESP: normal effort ABDOMEN: Soft, nontender, nondistended, gravid.  EXTREMITIES: Nontender, no edema NEURO: alert and oriented  SPECULUM EXAM: deferred.    FHT:  Baseline 145-150, moderate variability, accelerations present, no decelerations Contractions: rare, mild   Labs:  Results for orders placed during the hospital encounter of 02/29/12 (from the past 24  hour(s))  AMNISURE RUPTURE OF MEMBRANE (ROM)     Status: Normal   Collection Time   02/29/12  6:42 PM      Component Value Range   Amnisure ROM NEGATIVE         Assessment:  G2P1001 at [redacted]w[redacted]d without evident SROM or labor  Plan: Reassured Signs/symptoms labor reviewed Kick counts F/U [facility] 1 wk.     Ana Mejia 6/5/20136:58 PM

## 2012-03-05 NOTE — MAU Provider Note (Signed)
Attestation of Attending Supervision of Advanced Practitioner: Evaluation and management procedures were performed by the PA/NP/CNM/OB Fellow under my supervision/collaboration. Chart reviewed and agree with management and plan.  Tanny Harnack V 03/05/2012 7:21 PM    

## 2012-03-13 ENCOUNTER — Other Ambulatory Visit (HOSPITAL_COMMUNITY): Payer: Self-pay | Admitting: Physician Assistant

## 2012-03-13 DIAGNOSIS — O480 Post-term pregnancy: Secondary | ICD-10-CM

## 2012-03-16 ENCOUNTER — Ambulatory Visit (HOSPITAL_COMMUNITY)
Admission: RE | Admit: 2012-03-16 | Discharge: 2012-03-16 | Disposition: A | Discharge status: Home / Self Care | Payer: Medicaid Other | Source: Ambulatory Visit | Attending: Physician Assistant | Admitting: Physician Assistant

## 2012-03-16 ENCOUNTER — Telehealth (HOSPITAL_COMMUNITY): Payer: Self-pay | Admitting: *Deleted

## 2012-03-16 DIAGNOSIS — Z3689 Encounter for other specified antenatal screening: Secondary | ICD-10-CM | POA: Insufficient documentation

## 2012-03-16 DIAGNOSIS — O480 Post-term pregnancy: Secondary | ICD-10-CM

## 2012-03-16 NOTE — Telephone Encounter (Signed)
Preadmission screen  

## 2012-03-19 ENCOUNTER — Encounter (HOSPITAL_COMMUNITY): Payer: Self-pay | Admitting: *Deleted

## 2012-03-20 ENCOUNTER — Other Ambulatory Visit (HOSPITAL_COMMUNITY): Payer: Self-pay | Admitting: Family

## 2012-03-20 DIAGNOSIS — O480 Post-term pregnancy: Secondary | ICD-10-CM

## 2012-03-22 ENCOUNTER — Other Ambulatory Visit (HOSPITAL_COMMUNITY): Payer: Self-pay | Admitting: Family

## 2012-03-22 DIAGNOSIS — O480 Post-term pregnancy: Secondary | ICD-10-CM

## 2012-03-23 ENCOUNTER — Ambulatory Visit (HOSPITAL_COMMUNITY)
Admission: RE | Admit: 2012-03-23 | Discharge: 2012-03-23 | Disposition: A | Discharge status: Home / Self Care | Payer: Medicaid Other | Source: Ambulatory Visit | Attending: Family | Admitting: Family

## 2012-03-23 ENCOUNTER — Encounter (HOSPITAL_COMMUNITY): Payer: Self-pay

## 2012-03-23 ENCOUNTER — Inpatient Hospital Stay (HOSPITAL_COMMUNITY): Payer: Medicaid Other | Admitting: Anesthesiology

## 2012-03-23 ENCOUNTER — Inpatient Hospital Stay (HOSPITAL_COMMUNITY)
Admission: AD | Admit: 2012-03-23 | Discharge: 2012-03-26 | DRG: 775 | Disposition: A | Discharge status: Home / Self Care | Payer: Medicaid Other | Source: Ambulatory Visit | Attending: Obstetrics and Gynecology | Admitting: Obstetrics and Gynecology

## 2012-03-23 ENCOUNTER — Encounter (HOSPITAL_COMMUNITY): Payer: Self-pay | Admitting: Anesthesiology

## 2012-03-23 DIAGNOSIS — O480 Post-term pregnancy: Principal | ICD-10-CM | POA: Diagnosis present

## 2012-03-23 DIAGNOSIS — Z3689 Encounter for other specified antenatal screening: Secondary | ICD-10-CM | POA: Insufficient documentation

## 2012-03-23 DIAGNOSIS — O289 Unspecified abnormal findings on antenatal screening of mother: Secondary | ICD-10-CM | POA: Diagnosis present

## 2012-03-23 LAB — CBC
HCT: 38.6 % (ref 36.0–46.0)
MCV: 87.3 fL (ref 78.0–100.0)
RBC: 4.42 MIL/uL (ref 3.87–5.11)
WBC: 12.9 10*3/uL — ABNORMAL HIGH (ref 4.0–10.5)

## 2012-03-23 MED ORDER — ACETAMINOPHEN 325 MG PO TABS: 650.0000 mg | ORAL_TABLET | ORAL | Status: DC | PRN

## 2012-03-23 MED ORDER — OXYTOCIN 40 UNITS IN LACTATED RINGERS INFUSION - SIMPLE MED
62.5000 mL/h | Freq: Once | INTRAVENOUS | Status: AC
  Administered 2012-03-24: 62.5 mL/h via INTRAVENOUS
  Filled 2012-03-23: qty 1000

## 2012-03-23 MED ORDER — DIPHENHYDRAMINE HCL 50 MG/ML IJ SOLN: 12.5000 mg | INTRAMUSCULAR | Status: DC | PRN

## 2012-03-23 MED ORDER — FENTANYL 2.5 MCG/ML BUPIVACAINE 1/10 % EPIDURAL INFUSION (WH - ANES)
INTRAMUSCULAR | Status: DC | PRN
  Administered 2012-03-23: 14 mL/h via EPIDURAL

## 2012-03-23 MED ORDER — LIDOCAINE HCL (PF) 1 % IJ SOLN
30.0000 mL | INTRAMUSCULAR | Status: DC | PRN
  Filled 2012-03-23: qty 30

## 2012-03-23 MED ORDER — NALBUPHINE SYRINGE 5 MG/0.5 ML
5.0000 mg | INJECTION | INTRAMUSCULAR | Status: DC | PRN
  Administered 2012-03-23: 10 mg via INTRAVENOUS
  Filled 2012-03-23: qty 1

## 2012-03-23 MED ORDER — EPHEDRINE 5 MG/ML INJ
10.0000 mg | INTRAVENOUS | Status: DC | PRN
  Filled 2012-03-23: qty 4

## 2012-03-23 MED ORDER — OXYTOCIN BOLUS FROM INFUSION
250.0000 mL | Freq: Once | INTRAVENOUS | Status: AC
  Administered 2012-03-24: 250 mL via INTRAVENOUS
  Filled 2012-03-23: qty 500

## 2012-03-23 MED ORDER — PHENYLEPHRINE 40 MCG/ML (10ML) SYRINGE FOR IV PUSH (FOR BLOOD PRESSURE SUPPORT)
80.0000 ug | PREFILLED_SYRINGE | INTRAVENOUS | Status: DC | PRN
  Filled 2012-03-23: qty 5

## 2012-03-23 MED ORDER — LACTATED RINGERS IV SOLN: 500.0000 mL | INTRAVENOUS | Status: DC | PRN

## 2012-03-23 MED ORDER — CITRIC ACID-SODIUM CITRATE 334-500 MG/5ML PO SOLN: 30.0000 mL | ORAL | Status: DC | PRN

## 2012-03-23 MED ORDER — IBUPROFEN 600 MG PO TABS: 600.0000 mg | ORAL_TABLET | Freq: Four times a day (QID) | ORAL | Status: DC | PRN

## 2012-03-23 MED ORDER — LACTATED RINGERS IV SOLN
500.0000 mL | Freq: Once | INTRAVENOUS | Status: AC
  Administered 2012-03-23: 500 mL via INTRAVENOUS

## 2012-03-23 MED ORDER — PHENYLEPHRINE 40 MCG/ML (10ML) SYRINGE FOR IV PUSH (FOR BLOOD PRESSURE SUPPORT): 80.0000 ug | PREFILLED_SYRINGE | INTRAVENOUS | Status: DC | PRN

## 2012-03-23 MED ORDER — EPHEDRINE 5 MG/ML INJ: 10.0000 mg | INTRAVENOUS | Status: DC | PRN

## 2012-03-23 MED ORDER — FLEET ENEMA 7-19 GM/118ML RE ENEM: 1.0000 | ENEMA | RECTAL | Status: DC | PRN

## 2012-03-23 MED ORDER — ONDANSETRON HCL 4 MG/2ML IJ SOLN: 4.0000 mg | Freq: Four times a day (QID) | INTRAMUSCULAR | Status: DC | PRN

## 2012-03-23 MED ORDER — OXYCODONE-ACETAMINOPHEN 5-325 MG PO TABS: 1.0000 | ORAL_TABLET | ORAL | Status: DC | PRN

## 2012-03-23 MED ORDER — SODIUM BICARBONATE 8.4 % IV SOLN
INTRAVENOUS | Status: DC | PRN
  Administered 2012-03-23: 4 mL via EPIDURAL

## 2012-03-23 MED ORDER — FENTANYL 2.5 MCG/ML BUPIVACAINE 1/10 % EPIDURAL INFUSION (WH - ANES)
14.0000 mL/h | INTRAMUSCULAR | Status: DC
  Administered 2012-03-24: 14 mL/h via EPIDURAL
  Filled 2012-03-23 (×2): qty 60

## 2012-03-23 MED ORDER — LACTATED RINGERS IV SOLN
INTRAVENOUS | Status: DC
  Administered 2012-03-23 (×2): via INTRAVENOUS

## 2012-03-23 NOTE — MAU Note (Signed)
Patient is in for labor eval. States that thaT ctx are q44mins since 1400pm. She denies any vaginal bleeding or lof. Reports good fetal movement

## 2012-03-23 NOTE — Anesthesia Procedure Notes (Signed)
Epidural Patient location during procedure: OB  Preanesthetic Checklist Completed: patient identified, site marked, surgical consent, pre-op evaluation, timeout performed, IV checked, risks and benefits discussed and monitors and equipment checked  Epidural Patient position: sitting Prep: site prepped and draped and DuraPrep Patient monitoring: continuous pulse ox and blood pressure Approach: midline Injection technique: LOR air  Needle:  Needle type: Tuohy  Needle gauge: 17 G Needle length: 9 cm Needle insertion depth: 5 cm cm Catheter type: closed end flexible Catheter size: 19 Gauge Catheter at skin depth: 11 cm Test dose: negative  Assessment Events: blood not aspirated, injection not painful, no injection resistance, negative IV test and no paresthesia  Additional Notes Dosing of Epidural:  1st dose, through needle ............................................Marland Kitchen epi 1:200K + Xylocaine 40 mg  2nd dose, through catheter, after waiting 3 minutes...Marland KitchenMarland Kitchenepi 1:200K + Xylocaine 40 mg  3rd dose, through catheter after waiting 3 minutes .............................Marcaine   4mg    ( mg Marcaine are expressed as equivilent  cc's medication removed from the 0.1%Bupiv / fentanyl syringe from L&D pump)  ( 2% Xylo charted as a single dose in Epic Meds for ease of charting; actual dosing was fractionated as above, for saftey's sake)  As each dose occurred, patient was free of IV sx; and patient exhibited no evidence of SA injection.  Patient is more comfortable after epidural dosed. Please see RN's note for documentation of vital signs,and FHR which are stable.  Patient reminded not to try to ambulate with numb legs, and that an RN must be present the 1st time she attempts to get up.

## 2012-03-23 NOTE — Anesthesia Preprocedure Evaluation (Signed)

## 2012-03-23 NOTE — Progress Notes (Signed)
Provider made aware of pt status: FHT tracing with variable, uterine contraction pattern, SROM with mec stained fluid and last SVE. Will continue to monitor.

## 2012-03-23 NOTE — H&P (Signed)
Ana Mejia is a 21 y.o. female presenting for labor evaluation.  She is breathing with contractions upon arrival.  She has IOL scheduled tomorrow for postdates.  She reports good fetal movement, denies LOF, vaginal bleeding, vaginal itching/burning, urinary symptoms, h/a, dizziness, n/v, or fever/chills.    Maternal Medical History:  Reason for admission: Reason for admission: contractions.  Reason for Admission:   nauseaFHR decelerations, postdates  Contractions: Onset was 3-5 hours ago.   Frequency: regular.   Duration is approximately 1 minute.   Perceived severity is strong.    Fetal activity: Perceived fetal activity is normal.   Last perceived fetal movement was within the past hour.    Prenatal Complications - Diabetes: none.    OB History    Grav Para Term Preterm Abortions TAB SAB Ect Mult Living   2 1 1       1      Past Medical History  Diagnosis Date  . Headache   . Depression     ppd  . PTSD (post-traumatic stress disorder)    Past Surgical History  Procedure Date  . Tonsillectomy   . Wisdom tooth extraction   . Tympanostomy tube placement     x3   Family History: family history includes Autism in her sister; Cancer in her maternal grandmother; Diabetes in her maternal grandmother; Heart disease in her paternal grandfather; Hypertension in her maternal grandmother and mother; Peripheral vascular disease in her maternal grandfather and maternal grandmother; and Thyroid disease in her sister. Social History:  reports that she quit smoking about 7 months ago. She does not have any smokeless tobacco history on file. She reports that she does not drink alcohol or use illicit drugs.   Prenatal Transfer Tool  Maternal Diabetes: No 1 hour GTT 93 Genetic Screening: Declined, Too late to care Maternal Ultrasounds/Referrals: Normal Fetal Ultrasounds or other Referrals:  None Maternal Substance Abuse:  Yes:  Type: Marijuana, positive UDS for marijuana  12/2011 Significant Maternal Medications:  None Significant Maternal Lab Results:  None Other Comments:  GBS negative  Review of Systems  Constitutional: Negative for fever, chills and malaise/fatigue.  Eyes: Negative for blurred vision.  Respiratory: Negative for cough and shortness of breath.   Cardiovascular: Negative for chest pain.  Gastrointestinal: Positive for abdominal pain. Negative for heartburn, nausea and vomiting.  Genitourinary: Negative for dysuria, urgency and frequency.  Musculoskeletal: Negative.   Neurological: Negative for dizziness and headaches.  Psychiatric/Behavioral: Negative for depression.    Dilation: 3 Effacement (%): 80 Station: -2 Exam by:: Peace, rn Blood pressure 126/82, pulse 93, temperature 97.7 F (36.5 C), temperature source Oral, resp. rate 18, last menstrual period 07/13/2011. Maternal Exam:  Uterine Assessment: Contraction strength is moderate.  Contraction duration is 70 seconds. Contraction frequency is irregular.   Abdomen: Patient reports no abdominal tenderness. Estimated fetal weight is 6.5lbs.   Fetal presentation: vertex  Cervix: Cervix evaluated by digital exam.     Fetal Exam Fetal Monitor Review: Mode: ultrasound.   Baseline rate: 145.  Variability: moderate (6-25 bpm).   Pattern: accelerations present, late decelerations and variable decelerations.    Fetal State Assessment: Category II - tracings are indeterminate.     Physical Exam  Nursing note and vitals reviewed. Constitutional: She is oriented to person, place, and time. She appears well-developed and well-nourished.  Neck: Normal range of motion.  Cardiovascular: Normal rate, regular rhythm and normal heart sounds.   Respiratory: Effort normal.  GI: Soft.  Musculoskeletal: Normal range of motion.  Neurological: She is alert and oriented to person, place, and time. She has normal reflexes.  Skin: Skin is warm and dry.  Psychiatric: She has a normal mood and  affect. Her behavior is normal. Judgment and thought content normal.    Prenatal labs: ABO, Rh: B/Negative/-- (04/01 0000) Antibody: Negative (04/01 0000) Rubella: Equivocal (04/01 0000) RPR: Nonreactive (04/01 0000)  HBsAg: Negative (04/01 0000)  HIV: Non-reactive (04/01 0000)  GBS: Negative (05/23 0000)   Assessment/Plan: FHR baseline with decelerations, lates and variables Postdates pregnancy at 41.2 GBS negative  Admit to birthing suites Expectant management  May have epidural when desired Anticipate NSVD    Mejia, Ana Dunlop 03/23/2012, 9:13 PM

## 2012-03-24 ENCOUNTER — Inpatient Hospital Stay (HOSPITAL_COMMUNITY): Admission: RE | Admit: 2012-03-24 | Payer: Self-pay | Source: Ambulatory Visit

## 2012-03-24 ENCOUNTER — Encounter (HOSPITAL_COMMUNITY): Payer: Self-pay | Admitting: Pediatric Intensive Care

## 2012-03-24 DIAGNOSIS — O480 Post-term pregnancy: Secondary | ICD-10-CM

## 2012-03-24 LAB — RAPID URINE DRUG SCREEN, HOSP PERFORMED
Barbiturates: NOT DETECTED
Cocaine: NOT DETECTED

## 2012-03-24 MED ORDER — ONDANSETRON HCL 4 MG PO TABS: 4.0000 mg | ORAL_TABLET | ORAL | Status: DC | PRN

## 2012-03-24 MED ORDER — SENNOSIDES-DOCUSATE SODIUM 8.6-50 MG PO TABS
2.0000 | ORAL_TABLET | Freq: Every day | ORAL | Status: DC
  Administered 2012-03-24 – 2012-03-25 (×2): 2 via ORAL

## 2012-03-24 MED ORDER — BENZOCAINE-MENTHOL 20-0.5 % EX AERO: 1.0000 "application " | INHALATION_SPRAY | CUTANEOUS | Status: DC | PRN

## 2012-03-24 MED ORDER — LANOLIN HYDROUS EX OINT: TOPICAL_OINTMENT | CUTANEOUS | Status: DC | PRN

## 2012-03-24 MED ORDER — RHO D IMMUNE GLOBULIN 1500 UNIT/2ML IJ SOLN
300.0000 ug | Freq: Once | INTRAMUSCULAR | Status: AC
  Administered 2012-03-24: 300 ug via INTRAMUSCULAR
  Filled 2012-03-24: qty 2

## 2012-03-24 MED ORDER — DIBUCAINE 1 % RE OINT: 1.0000 "application " | TOPICAL_OINTMENT | RECTAL | Status: DC | PRN

## 2012-03-24 MED ORDER — WITCH HAZEL-GLYCERIN EX PADS: 1.0000 "application " | MEDICATED_PAD | CUTANEOUS | Status: DC | PRN

## 2012-03-24 MED ORDER — TETANUS-DIPHTH-ACELL PERTUSSIS 5-2.5-18.5 LF-MCG/0.5 IM SUSP
0.5000 mL | Freq: Once | INTRAMUSCULAR | Status: AC
  Administered 2012-03-25: 0.5 mL via INTRAMUSCULAR
  Filled 2012-03-24: qty 0.5

## 2012-03-24 MED ORDER — IBUPROFEN 600 MG PO TABS
600.0000 mg | ORAL_TABLET | Freq: Four times a day (QID) | ORAL | Status: DC
  Administered 2012-03-24 – 2012-03-26 (×9): 600 mg via ORAL
  Filled 2012-03-24 (×9): qty 1

## 2012-03-24 MED ORDER — SIMETHICONE 80 MG PO CHEW: 80.0000 mg | CHEWABLE_TABLET | ORAL | Status: DC | PRN

## 2012-03-24 MED ORDER — DIPHENHYDRAMINE HCL 25 MG PO CAPS: 25.0000 mg | ORAL_CAPSULE | Freq: Four times a day (QID) | ORAL | Status: DC | PRN

## 2012-03-24 MED ORDER — ONDANSETRON HCL 4 MG/2ML IJ SOLN: 4.0000 mg | INTRAMUSCULAR | Status: DC | PRN

## 2012-03-24 MED ORDER — ZOLPIDEM TARTRATE 5 MG PO TABS: 5.0000 mg | ORAL_TABLET | Freq: Every evening | ORAL | Status: DC | PRN

## 2012-03-24 MED ORDER — PRENATAL MULTIVITAMIN CH
1.0000 | ORAL_TABLET | Freq: Every day | ORAL | Status: DC
  Administered 2012-03-24 – 2012-03-26 (×3): 1 via ORAL
  Filled 2012-03-24 (×3): qty 1

## 2012-03-24 MED ORDER — OXYCODONE-ACETAMINOPHEN 5-325 MG PO TABS
1.0000 | ORAL_TABLET | ORAL | Status: DC | PRN
  Administered 2012-03-24 (×2): 1 via ORAL
  Administered 2012-03-25: 2 via ORAL
  Administered 2012-03-25: 1 via ORAL
  Administered 2012-03-25: 2 via ORAL
  Administered 2012-03-25 – 2012-03-26 (×4): 1 via ORAL
  Filled 2012-03-24 (×4): qty 1
  Filled 2012-03-24: qty 2
  Filled 2012-03-24 (×3): qty 1
  Filled 2012-03-24: qty 2

## 2012-03-24 NOTE — Anesthesia Postprocedure Evaluation (Signed)
  Anesthesia Post-op Note  Patient: Ana Mejia  Procedure(s) Performed: * No procedures listed *  Patient Location: PACU and Mother/Baby  Anesthesia Type: Epidural  Level of Consciousness: awake, alert  and oriented  Airway and Oxygen Therapy: Patient Spontanous Breathing  Post-op Pain: none  Post-op Assessment: Post-op Vital signs reviewed and Patient's Cardiovascular Status Stable  Post-op Vital Signs: Reviewed and stable  Complications: No apparent anesthesia complications

## 2012-03-24 NOTE — Progress Notes (Signed)
Provider made aware of FHT, uterine contraction pattern and last SVE.

## 2012-03-24 NOTE — Progress Notes (Signed)
Pt delivered viable female SVD 8, 9 APGARS. CNM Sharen Counter present.

## 2012-03-25 LAB — RH IG WORKUP (INCLUDES ABO/RH): Antibody Screen: POSITIVE

## 2012-03-25 NOTE — Progress Notes (Signed)
PSYCHOSOCIAL ASSESSMENT ~ MATERNAL/CHILD Name: Ana Mejia          Age: 22 day    Referral Date: 03/24/2012   Reason/Source: Positive prenatal drug screen/multiple charges-court involvement  I. FAMILY/HOME ENVIRONMENT A. Child's Legal Guardian Parent(s)     Name:  Ana Mejia DOB: [birth year]    Age: 44 Address: [address] [place] [address] Name: Ana Mejia [phone]) Address: same  B. Other Household Members/Support Persons   Sister Ana Mejia-age 76  C.   Other Support: maternal grandmother, FOB's parents   II. PSYCHOSOCIAL DATA A. Information Source X Patient Interview    B. Financial and [employer]  X Food Stamps      X WIC- mom will apply and re-certify for sibling   C. Cultural and Environment Information: Cultural Issues Impacting Care-N/A  III. STRENGTHS X Supportive family/friends   X Adequate Resources      X Other-[facility]  IV. RISK FACTORS AND CURRENT PROBLEMS     X Substance Abuse-Marijuana        X Other -Multiple charges-court involvement            V. SOCIAL WORK ASSESSMENT  Met with [name] and infant at bedside.  [name] was side lying with her infant comfortably.  We discussed referral reasons.  [name] was in jail reportedly from March to May 12th for a 60 day sentence.  She was brought to a prenatal appointment by [facility] jail and had a positive UDS for Marijuana on 12/26/11.  She had another positive UDS for marijuana on 02/09/2012, 4 days after her reported release from jail.  [name] is unsure how she tested positive on 12/26/2011 since she was incarcerated.  She admits to using MJ after her release because after reuniting with her 22 year old daughter, she did not want to go to [facility] because child did not recognize her.  [name] became tearful in talking about this.  Her two year old was reportedly with grandparents while she was in jail. [name] reports not using since April.  She reports she does not want to jeopardize her relationship with  her children.   [name] reportedly had jail time due to unpaid restitution stemming from a charge of obtaining property under false pretense reportedly committed at 82 when she was associating with older people.  She is reportedly on probation until May 2014.  She is reportedly going to receive an ankle bracelet, though she claims that was discussed 6 weeks ago and it has not happened.  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  She plans to return to her employer in 4-6 weeks, and they have discussed random drug screening in being able to maintain employment.  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    [name] reports she completed the Task program in [facility].  She reported trying go through drug court in [place], but the process is different and it would conflict with her ability to work. [name] admitted to using MJ heavily in the past, as much as 4-5 blunts a day.  She reports not having any "cravings" or desire to use, though she admits not having a plan for stress or depression management.  FOB has reportedly been "home".  He plans to return to work at [employer] in [place], reportedly owned by his family.  When asked about  paying rent, utilities and getting things for the baby, [name] reports that they saved up money.  [name] reported not having DSS involvement.    VI. SOCIAL WORK PLAN X Psychosocial Support and Ongoing Assessment of Needs X Child Protective Services Report:  Ana Mejia, Ana Mejia Key contacted on 03/25/12.  I also informed CPS about my search for public records and [name]'s multiple charges since September 2012 including failure to comply with court order, probation violation, and felony failure to appear.  Hospital Registrar also reported that FOB unable to sign birth certificate due to not having an ID, reportedly stemming from some issues in [facility]. CPS to assess  family to determine discharge needs.    Ana Mejia, MSW LCSW, 03/25/2012, 5:12 pm

## 2012-03-25 NOTE — Progress Notes (Signed)
Post Partum Day 1 Subjective: up ad lib, voiding and tolerating PO; increased cramping, improves with percocet; breast/bottle feeding; desires tubal ligation.  Desires discharge tomorrow.  Objective: Blood pressure 121/81, pulse 66, temperature 98.2 F (36.8 C), temperature source Oral, resp. rate 18, height 5\' 4"  (1.626 m), weight 73.029 kg (161 lb), last menstrual period 07/13/2011, unknown if currently breastfeeding.  Physical Exam:  General: alert, cooperative and appears stated age Lochia: appropriate Uterine Fundus: firm Incision: n/a DVT Evaluation: No evidence of DVT seen on physical exam. Negative Homan's sign.   Basename 03/23/12 2200  HGB 13.4  HCT 38.6    Assessment/Plan: Plan for discharge tomorrow   LOS: 2 days   [facility] 03/25/2012, 7:33 AM

## 2012-03-26 MED ORDER — IBUPROFEN 600 MG PO TABS: 600.0000 mg | ORAL_TABLET | Freq: Four times a day (QID) | ORAL | Status: AC

## 2012-03-26 NOTE — Discharge Instructions (Signed)

## 2012-03-26 NOTE — Progress Notes (Signed)
CPS worker Ana Mejia, told Sw that infant safe to discharge home with [name].  Ana Mejia plans to follow up with the family at their home.

## 2012-03-26 NOTE — Progress Notes (Signed)
UR chart review completed.  

## 2012-03-26 NOTE — Discharge Summary (Signed)
Obstetric Discharge Summary Reason for Admission: onset of labor Prenatal Procedures: none Intrapartum Procedures: spontaneous vaginal delivery Postpartum Procedures: none Complications-Operative and Postpartum: none Hemoglobin  Date Value Range Status  03/23/2012 13.4  12.0 - 15.0 g/dL Final     HCT  Date Value Range Status  03/23/2012 38.6  36.0 - 46.0 % Final    Physical Exam:  General: alert, cooperative and no distress Heart: RRR Lungs: effort nl Lochia: appropriate Uterine Fundus: firm DVT Evaluation: No evidence of DVT seen on physical exam.  Discharge Diagnoses: Term Pregnancy-delivered  Discharge Information: Date: 03/26/2012 Activity: pelvic rest Diet: routine Medications: PNV and Ibuprofen Condition: stable Instructions: refer to practice specific booklet Discharge to: home Follow-up Information    Follow up with [facility] DE[facility] [place]. Schedule an appointment as soon as possible for a visit in 4 weeks.   Contact information:   [address] [employer] [place] [address]          Newborn Data: Live born female  Birth Weight: 7 lb 3.5 oz (3275 g) APGAR: 8, 9  Pt desires BTL but does not have Medicaid. Will eval at [facility] appt for desired contraception.  Home with waiting for eval by CPS- many social issues surrounding pt's recent incarceration. See SW note.  Cam Hai 03/26/2012, 6:28 AM

## 2012-03-30 NOTE — H&P (Signed)
Attestation of Attending Supervision of Advanced Practitioner: Evaluation and management procedures were performed by the PA/NP/CNM/OB Fellow under my supervision/collaboration. Chart reviewed and agree with management and plan.  Hertha Gergen V 03/30/2012 7:38 AM    

## 2012-03-31 NOTE — Discharge Summary (Signed)
Attestation of Attending Supervision of Advanced Practitioner: Evaluation and management procedures were performed by the PA/NP/CNM/OB Fellow under my supervision/collaboration. Chart reviewed and agree with management and plan.  Damarys Speir V 03/31/2012 5:57 AM    

## 2012-09-26 NOTE — L&D Delivery Note (Signed)
Delivery Note At 8:47 PM a viable female was delivered via spontaneous vaginal delivery.  APGAR:  Unavailable at time of note.   Placenta status: spontaneous, intact.  Cord:  3 vessel, with the following complications: None.   Anesthesia: Epidural  Episiotomy: None Lacerations: None Suture Repair: None Est. Blood Loss (mL): 50 cc  Mom to postpartum.  Baby to nursery-stable.  [facility] 03/13/2013, 9:03 PM

## 2012-09-26 NOTE — L&D Delivery Note (Signed)
I was present for the exam and agree with above.  [place], [place] 03/13/2013 10:39 PM

## 2012-12-07 IMAGING — US US FETAL BPP W/O NONSTRESS
1 series · 14 of 16 positions shown · non-contrast
Comparison: none

[Series 1: us fetal bpp w/o nonstress · non-contrast · 16 acquisitions, 14 frames shown]
[im 1/16]
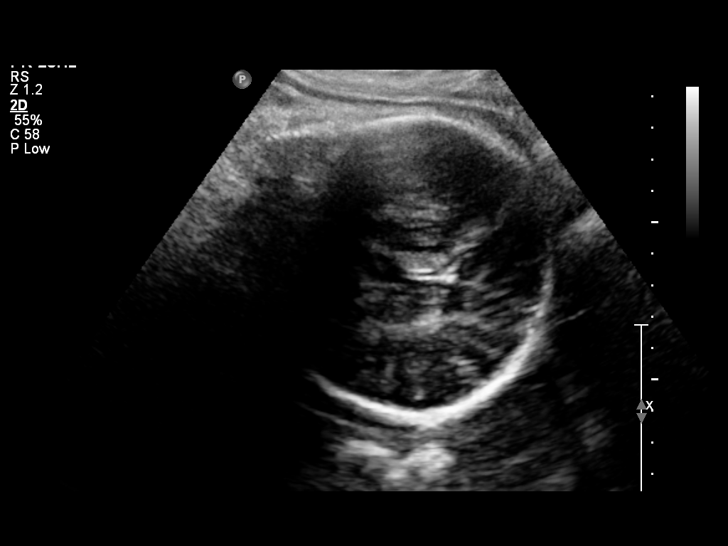
[im 2/16]
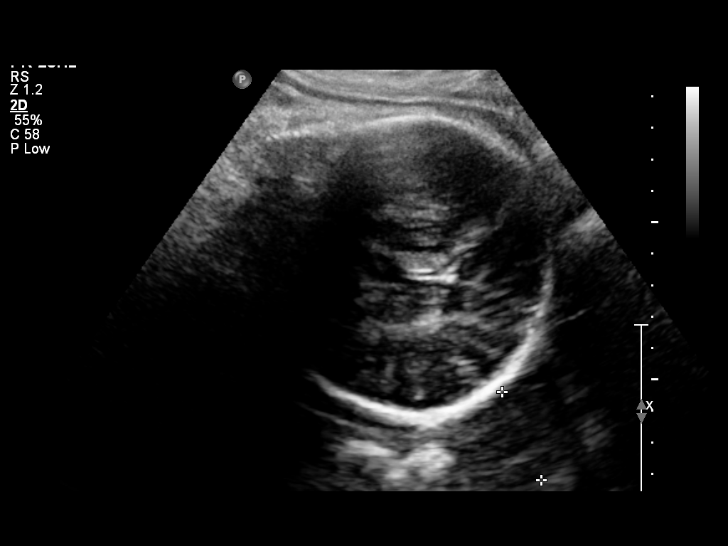
[im 3/16]
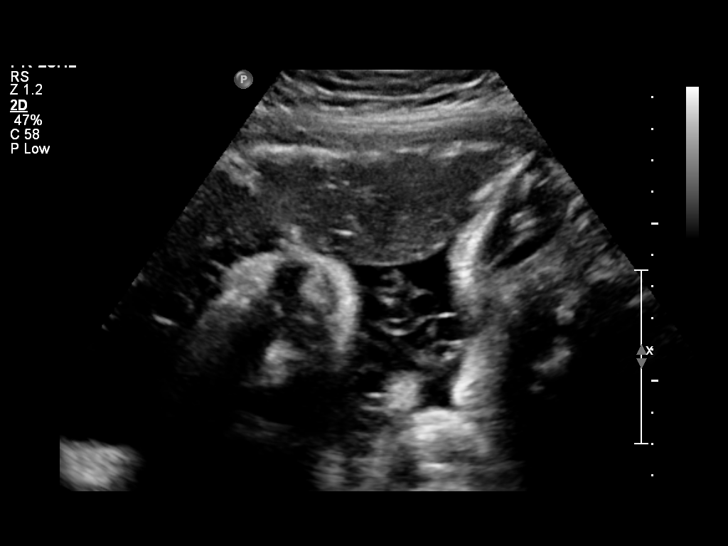
[im 5/16]
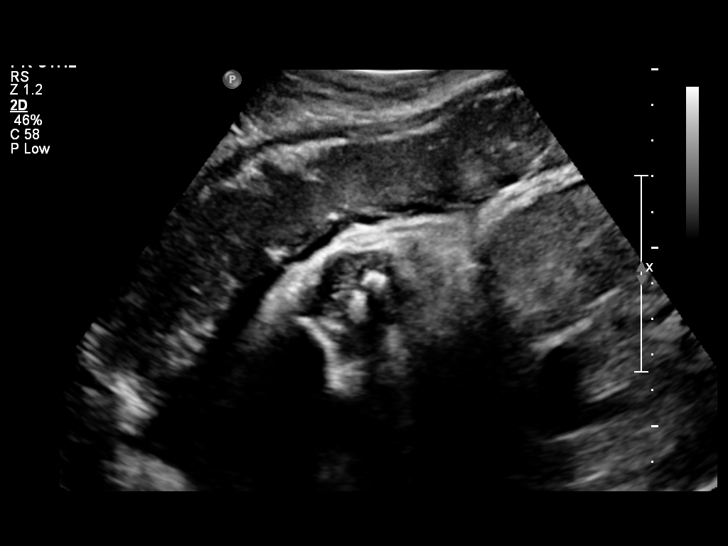
[im 6/16]
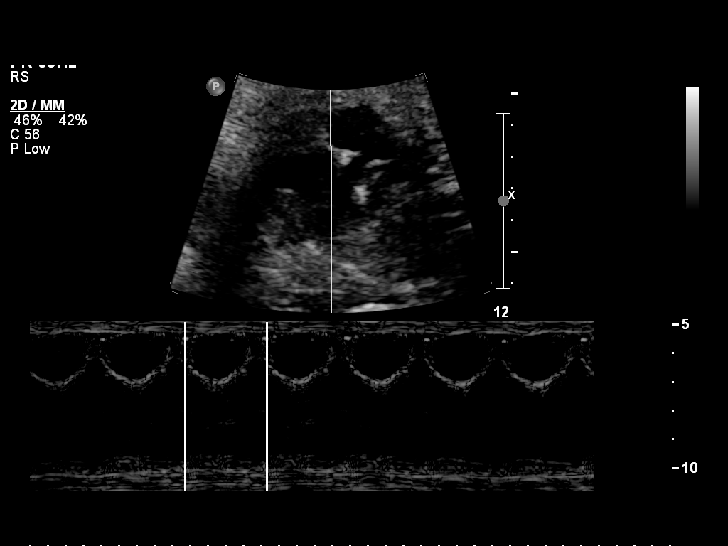
[im 7/16]
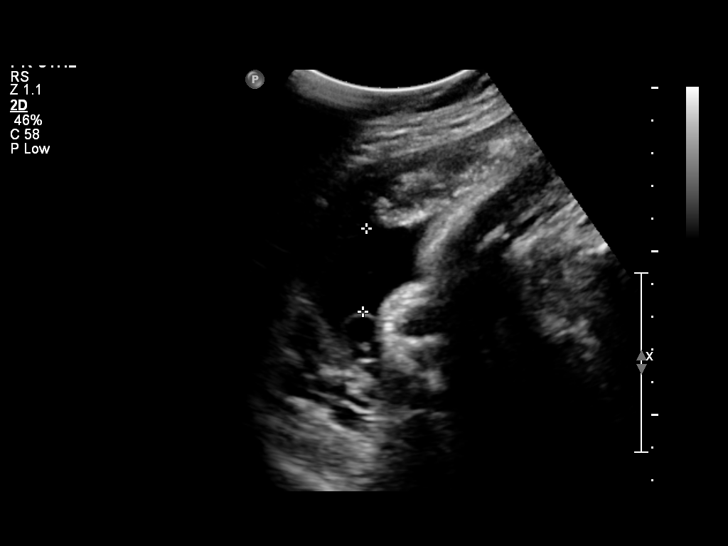
[im 8/16]
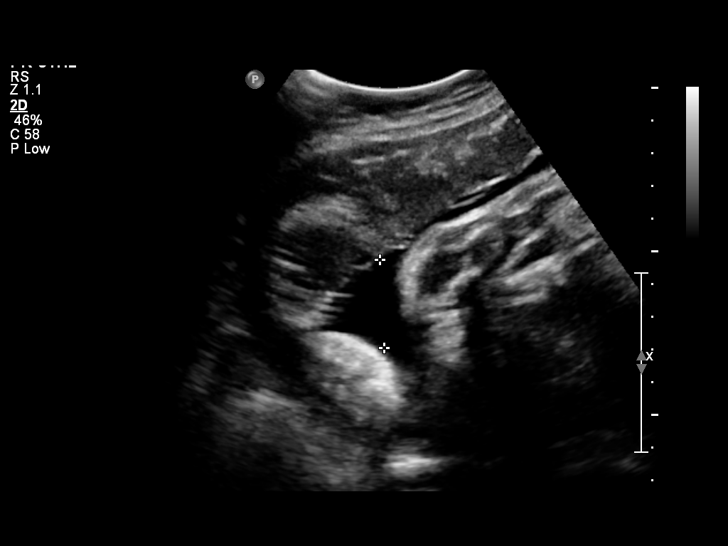
[im 9/16]
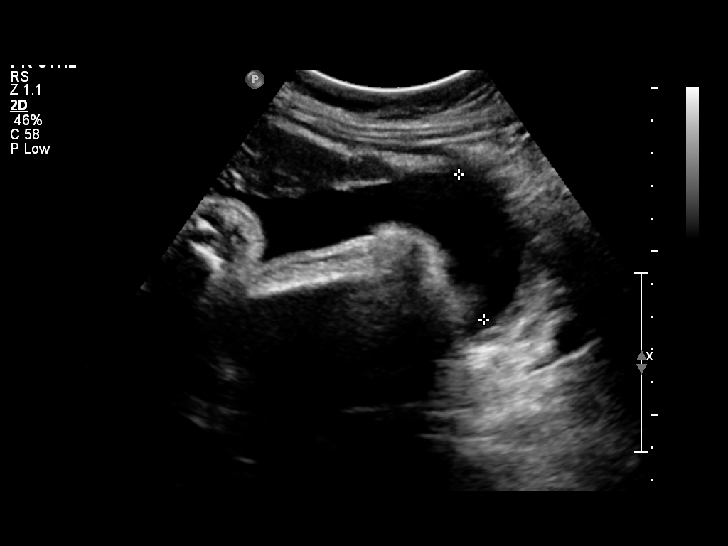
[im 10/16]
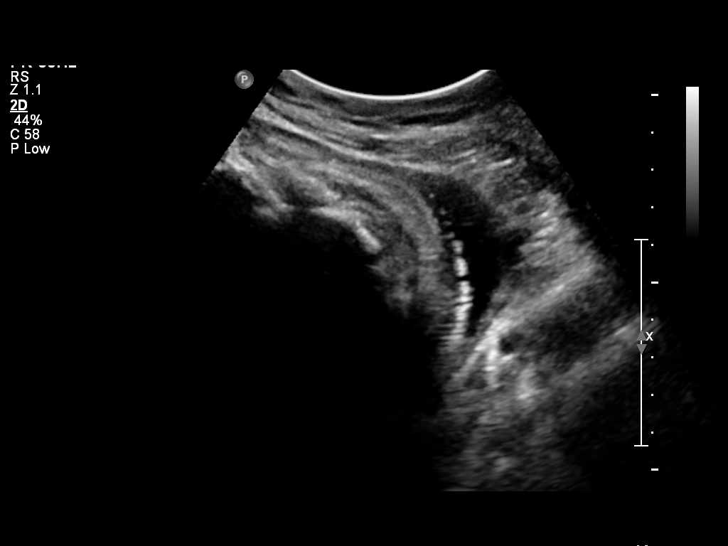
[im 11/16]
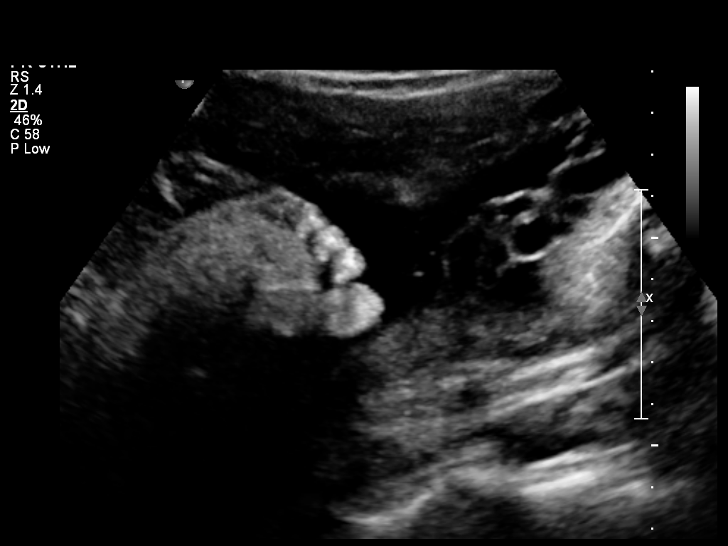
[im 13/16]
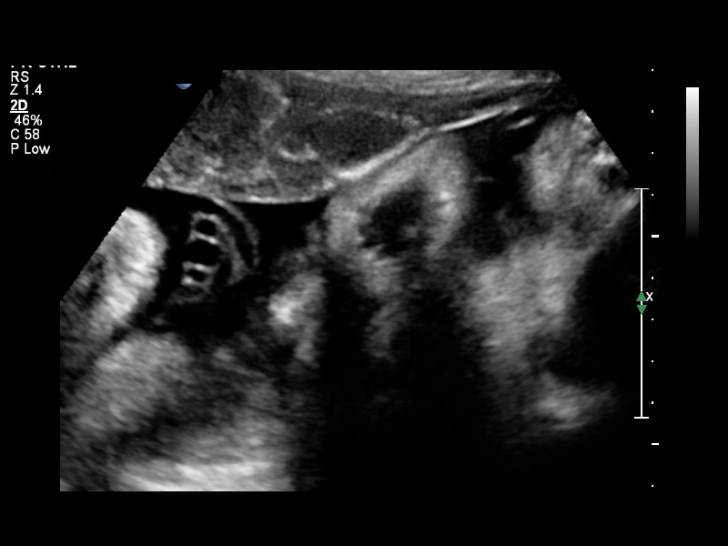
[im 14/16]
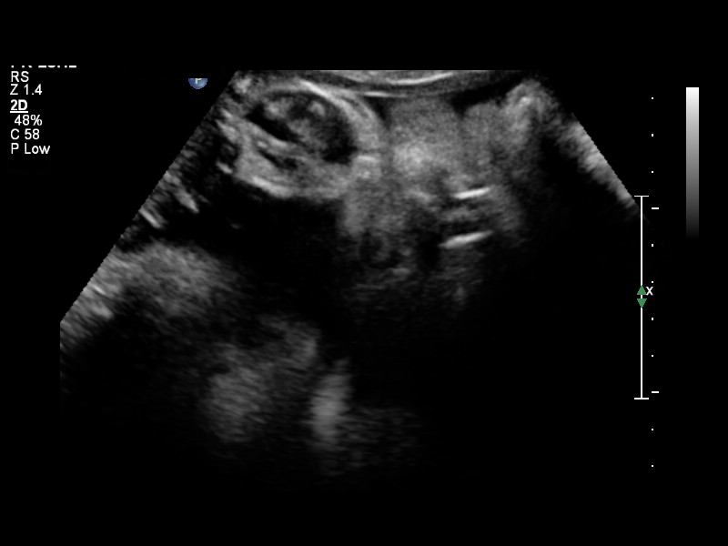
[im 15/16]
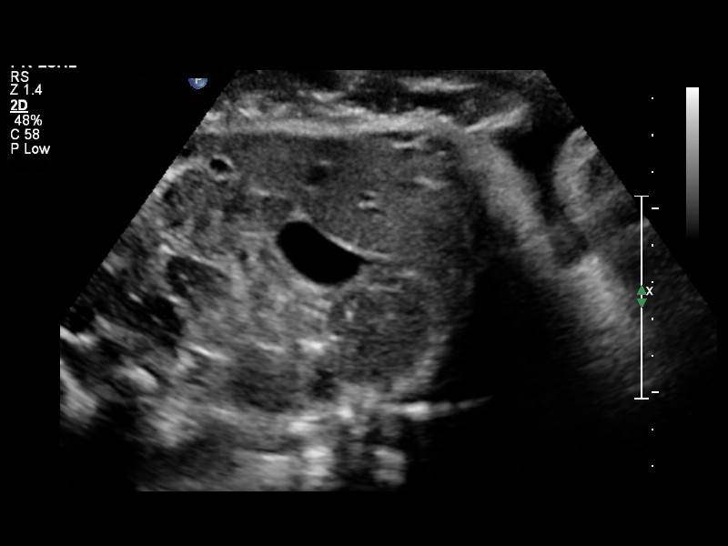
[im 16/16]
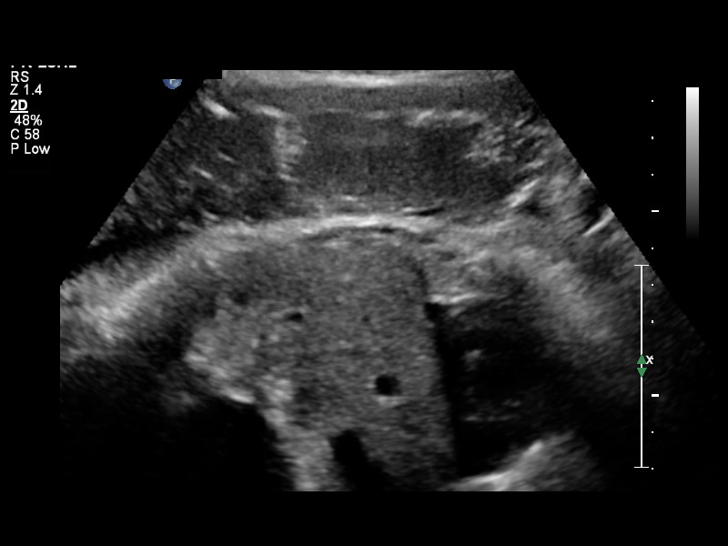

[14 of 16 positions shown; findings below may reference images not displayed]

OBSTETRICS REPORT
                      (Signed Final 03/16/2012 [DATE])

 Order#:         [id number]
                 58_O
Procedures

 [HOSPITAL]                                         76815.0
Indications

 Postdate pregnancy (40-42 weeks)
 Assess fetal well being
Fetal Evaluation

 Fetal Heart Rate:  130                         bpm
 Cardiac Activity:  Observed
 Presentation:      Cephalic
 Placenta:          Anterior, above cervical os

 Amniotic Fluid
 AFI FV:      Subjectively within normal limits
 AFI Sum:     9.7     cm      29   %Tile     Larg Pckt:   4.48   cm
 RUQ:   2.69   cm    RLQ:    4.48   cm    LLQ:   2.53    cm
Biophysical Evaluation

 Amniotic F.V:   Within normal limits       F. Tone:        Observed
 F. Movement:    Observed                   Score:          [DATE]
 F. Breathing:   Observed
Gestational Age

 Best:          40w 2d    Det. By:   Previous Ultrasound      EDD:   03/14/12
Cervix Uterus Adnexa

 Cervix:       Normal appearance by transabdominal scan.
Impression

 BPP [DATE].

 Subjectively and quantiatively normal amniotic fluid volume
 with a single pocket measuring > 2 x 2 cm.

 questions or concerns.

## 2012-12-26 ENCOUNTER — Emergency Department (HOSPITAL_COMMUNITY)
Admission: EM | Admit: 2012-12-26 | Discharge: 2012-12-26 | Disposition: A | Discharge status: Home / Self Care | Payer: Medicaid Other | Attending: Emergency Medicine | Admitting: Emergency Medicine

## 2012-12-26 ENCOUNTER — Encounter (HOSPITAL_COMMUNITY): Payer: Self-pay | Admitting: *Deleted

## 2012-12-26 DIAGNOSIS — IMO0001 Reserved for inherently not codable concepts without codable children: Secondary | ICD-10-CM | POA: Insufficient documentation

## 2012-12-26 DIAGNOSIS — R197 Diarrhea, unspecified: Secondary | ICD-10-CM | POA: Insufficient documentation

## 2012-12-26 DIAGNOSIS — Z8669 Personal history of other diseases of the nervous system and sense organs: Secondary | ICD-10-CM | POA: Insufficient documentation

## 2012-12-26 DIAGNOSIS — Z3490 Encounter for supervision of normal pregnancy, unspecified, unspecified trimester: Secondary | ICD-10-CM

## 2012-12-26 DIAGNOSIS — Z8659 Personal history of other mental and behavioral disorders: Secondary | ICD-10-CM | POA: Insufficient documentation

## 2012-12-26 DIAGNOSIS — R1110 Vomiting, unspecified: Secondary | ICD-10-CM

## 2012-12-26 DIAGNOSIS — O98519 Other viral diseases complicating pregnancy, unspecified trimester: Secondary | ICD-10-CM | POA: Insufficient documentation

## 2012-12-26 DIAGNOSIS — B9789 Other viral agents as the cause of diseases classified elsewhere: Secondary | ICD-10-CM | POA: Insufficient documentation

## 2012-12-26 DIAGNOSIS — R509 Fever, unspecified: Secondary | ICD-10-CM | POA: Insufficient documentation

## 2012-12-26 DIAGNOSIS — R05 Cough: Secondary | ICD-10-CM | POA: Insufficient documentation

## 2012-12-26 DIAGNOSIS — O9989 Other specified diseases and conditions complicating pregnancy, childbirth and the puerperium: Secondary | ICD-10-CM | POA: Insufficient documentation

## 2012-12-26 DIAGNOSIS — B349 Viral infection, unspecified: Secondary | ICD-10-CM

## 2012-12-26 DIAGNOSIS — O210 Mild hyperemesis gravidarum: Secondary | ICD-10-CM | POA: Insufficient documentation

## 2012-12-26 DIAGNOSIS — R059 Cough, unspecified: Secondary | ICD-10-CM | POA: Insufficient documentation

## 2012-12-26 DIAGNOSIS — J029 Acute pharyngitis, unspecified: Secondary | ICD-10-CM | POA: Insufficient documentation

## 2012-12-26 LAB — URINE MICROSCOPIC-ADD ON

## 2012-12-26 LAB — COMPREHENSIVE METABOLIC PANEL
BUN: 4 mg/dL — ABNORMAL LOW (ref 6–23)
CO2: 20 mEq/L (ref 19–32)
Chloride: 104 mEq/L (ref 96–112)
Creatinine, Ser: 0.38 mg/dL — ABNORMAL LOW (ref 0.50–1.10)
GFR calc non Af Amer: >90 mL/min (ref >90)
Glucose, Bld: 97 mg/dL (ref 70–99)
Total Bilirubin: 0.2 mg/dL — ABNORMAL LOW (ref 0.3–1.2)

## 2012-12-26 LAB — CBC WITH DIFFERENTIAL/PLATELET
HCT: 31.9 % — ABNORMAL LOW (ref 36.0–46.0)
Hemoglobin: 11.1 g/dL — ABNORMAL LOW (ref 12.0–15.0)
Lymphocytes Relative: 19 % (ref 12–46)
MCHC: 34.8 g/dL (ref 30.0–36.0)
Monocytes Absolute: 0.4 10*3/uL (ref 0.1–1.0)
Monocytes Relative: 8 % (ref 3–12)
Neutro Abs: 3.9 10*3/uL (ref 1.7–7.7)
WBC: 5.4 10*3/uL (ref 4.0–10.5)

## 2012-12-26 LAB — URINALYSIS, ROUTINE W REFLEX MICROSCOPIC
Bilirubin Urine: NEGATIVE
Nitrite: NEGATIVE
Specific Gravity, Urine: 1.028 (ref 1.005–1.030)
Urobilinogen, UA: 1 mg/dL (ref 0.0–1.0)

## 2012-12-26 LAB — RAPID STREP SCREEN (MED CTR MEBANE ONLY): Streptococcus, Group A Screen (Direct): NEGATIVE

## 2012-12-26 MED ORDER — SODIUM CHLORIDE 0.9 % IV BOLUS (SEPSIS)
1000.0000 mL | Freq: Once | INTRAVENOUS | Status: AC
  Administered 2012-12-26: 1000 mL via INTRAVENOUS

## 2012-12-26 MED ORDER — SODIUM CHLORIDE 0.9 % IV BOLUS (SEPSIS)
500.0000 mL | Freq: Once | INTRAVENOUS | Status: AC
  Administered 2012-12-26: 500 mL via INTRAVENOUS

## 2012-12-26 MED ORDER — ONDANSETRON HCL 4 MG/2ML IJ SOLN
4.0000 mg | Freq: Once | INTRAMUSCULAR | Status: AC
  Administered 2012-12-26: 4 mg via INTRAVENOUS
  Filled 2012-12-26: qty 2

## 2012-12-26 MED ORDER — ONDANSETRON HCL 4 MG PO TABS: 4.0000 mg | ORAL_TABLET | Freq: Four times a day (QID) | ORAL | Status: DC

## 2012-12-26 NOTE — ED Provider Notes (Signed)
History     CSN: [id number]  Arrival date & time 12/26/12  2440   First MD Initiated Contact with Patient 12/26/12 [phone]      Chief Complaint  Patient presents with  . Nausea  . Emesis  . Fever    (Consider location/radiation/quality/duration/timing/severity/associated sxs/prior treatment) HPI Comments: Patient comes to the ER for evaluation of nausea, vomiting and diarrhea. Symptoms began yesterday. She reports that she has not been able to anything down overnight. She did not want to miss work, came to the ER this morning for treatment. She reports that she has had generalized muscle aches and a fever to 101 at home. Has not had any urinary symptoms. Patient does report sore throat and cough.  Patient is approximately [redacted] weeks pregnant. No vaginal discharge, vaginal bleeding. Patient is not experiencing any pelvic pain or cramping. She does report fetal movement.  Patient is a 22 y.o. female presenting with vomiting and fever.  Emesis Associated symptoms: diarrhea, myalgias and sore throat   Fever Associated symptoms: cough, diarrhea, myalgias, nausea, sore throat and vomiting     Past Medical History  Diagnosis Date  . Headache   . Depression     ppd  . PTSD (post-traumatic stress disorder)     Past Surgical History  Procedure Laterality Date  . Tonsillectomy    . Wisdom tooth extraction    . Tympanostomy tube placement      x3    Family History  Problem Relation Age of Onset  . Hypertension Mother   . Autism Sister   . Thyroid disease Sister   . Hypertension Maternal Grandmother   . Peripheral vascular disease Maternal Grandmother   . Diabetes Maternal Grandmother   . Cancer Maternal Grandmother     breast  . Peripheral vascular disease Maternal Grandfather   . Heart disease Paternal Grandfather     History  Substance Use Topics  . Smoking status: Former Smoker -- 1.00 packs/day    Quit date: 08/01/2011  . Smokeless tobacco: Not on file  . Alcohol Use:  No    OB History   Grav Para Term Preterm Abortions TAB SAB Ect Mult Living   2 2 2       2       Review of Systems  Constitutional: Positive for fever.  HENT: Positive for sore throat.   Respiratory: Positive for cough.   Gastrointestinal: Positive for nausea, vomiting and diarrhea.  Musculoskeletal: Positive for myalgias.  All other systems reviewed and are negative.    Allergies  Other  Home Medications  No current outpatient prescriptions on file.  BP 142/110  Pulse 128  Temp(Src) 98.7 F (37.1 C) (Oral)  Resp 22  SpO2 99%  Physical Exam  Constitutional: She is oriented to person, place, and time. She appears well-developed and well-nourished. No distress.  HENT:  Head: Normocephalic and atraumatic.  Right Ear: Hearing normal.  Nose: Nose normal.  Mouth/Throat: Oropharynx is clear and moist and mucous membranes are normal.  Eyes: Conjunctivae and EOM are normal. Pupils are equal, round, and reactive to light.  Neck: Normal range of motion. Neck supple.  Cardiovascular: Normal rate, regular rhythm, S1 normal and S2 normal.  Exam reveals no gallop and no friction rub.   No murmur heard. Pulmonary/Chest: Effort normal and breath sounds normal. No respiratory distress. She exhibits no tenderness.  Abdominal: Soft. Normal appearance and bowel sounds are normal. There is no hepatosplenomegaly. There is no tenderness. There is no rebound, no guarding,  no tenderness at McBurney's point and negative Murphy's sign. No hernia.  Musculoskeletal: Normal range of motion.  Neurological: She is alert and oriented to person, place, and time. She has normal strength. No cranial nerve deficit or sensory deficit. Coordination normal. GCS eye subscore is 4. GCS verbal subscore is 5. GCS motor subscore is 6.  Skin: Skin is warm, dry and intact. No rash noted. No cyanosis.  Psychiatric: She has a normal mood and affect. Her speech is normal and behavior is normal. Thought content normal.     ED Course  Procedures (including critical care time)  Labs Reviewed  CBC WITH DIFFERENTIAL - Abnormal; Notable for the following:    RBC 3.58 (*)    Hemoglobin 11.1 (*)    HCT 31.9 (*)    All other components within normal limits  COMPREHENSIVE METABOLIC PANEL - Abnormal; Notable for the following:    Sodium 134 (*)    Potassium 3.4 (*)    BUN 4 (*)    Creatinine, Ser 0.38 (*)    Calcium 8.0 (*)    Albumin 2.6 (*)    Total Bilirubin 0.2 (*)    All other components within normal limits  URINALYSIS, ROUTINE W REFLEX MICROSCOPIC - Abnormal; Notable for the following:    Leukocytes, UA SMALL (*)    All other components within normal limits  URINE MICROSCOPIC-ADD ON - Abnormal; Notable for the following:    Squamous Epithelial / LPF FEW (*)    All other components within normal limits  CULTURE, BLOOD (ROUTINE X 2)  CULTURE, BLOOD (ROUTINE X 2)  RAPID STREP SCREEN   No results found.   Diagnosis: 1. Vomiting 2. Dehydration    MDM  Patient presented to the ER for evaluation of nausea, vomiting and low-grade fever. She is experiencing generalized myalgias. Symptoms are most likely secondary to viral syndrome. Workup was entirely unremarkable otherwise. She did have tachycardia, likely secondary to volume depletion. This resolved with IV hydration. Patient is feeling much better. She was discharged with symptomatic treatment. Cultures pending.        Gilda Crease, MD 12/27/12 1240

## 2012-12-26 NOTE — ED Notes (Signed)
Pt escorted to discharge window. Pt verbalized understanding discharge instructions. In no acute distress.  

## 2012-12-26 NOTE — ED Notes (Signed)
Pt reports n/v, fever and chills since yesterday. Has not taken any meds today.

## 2012-12-26 NOTE — ED Notes (Signed)
Pt [redacted] weeks pregnant, 3rd pregnancy, denies vaginal discharge. Reports n/v and chills x2 days with sore throat 6/10.

## 2012-12-26 NOTE — ED Notes (Signed)
md at bedside  Pt alert and oriented x4. Respirations even and unlabored, bilateral symmetrical rise and fall of chest. Skin warm and dry. In no acute distress. Denies needs.   

## 2012-12-26 NOTE — Progress Notes (Signed)
[facility] ED CM noted pt with coverage but no pcp listed Spoke with pt who confirms no pcp but aware of how to obtain one with insurance coverage customer service number or web site  She reports she prefers to only see her OB GYN, Dr Ferdinand Cava for most needs

## 2013-01-01 LAB — CULTURE, BLOOD (ROUTINE X 2): Culture: NO GROWTH

## 2013-01-15 ENCOUNTER — Encounter (HOSPITAL_COMMUNITY): Payer: Self-pay | Admitting: *Deleted

## 2013-01-15 ENCOUNTER — Inpatient Hospital Stay (HOSPITAL_COMMUNITY)
Admission: AD | Admit: 2013-01-15 | Discharge: 2013-01-16 | Disposition: A | Discharge status: Home / Self Care | Payer: Medicaid Other | Source: Ambulatory Visit | Attending: Obstetrics & Gynecology | Admitting: Obstetrics & Gynecology

## 2013-01-15 DIAGNOSIS — O219 Vomiting of pregnancy, unspecified: Secondary | ICD-10-CM

## 2013-01-15 DIAGNOSIS — O210 Mild hyperemesis gravidarum: Secondary | ICD-10-CM | POA: Insufficient documentation

## 2013-01-15 LAB — COMPREHENSIVE METABOLIC PANEL
AST: 14 U/L (ref 0–37)
Albumin: 2.9 g/dL — ABNORMAL LOW (ref 3.5–5.2)
Alkaline Phosphatase: 82 U/L (ref 39–117)
BUN: 6 mg/dL (ref 6–23)
Potassium: 3.8 mEq/L (ref 3.5–5.1)
Sodium: 137 mEq/L (ref 135–145)
Total Protein: 6.6 g/dL (ref 6.0–8.3)

## 2013-01-15 LAB — URINE MICROSCOPIC-ADD ON

## 2013-01-15 LAB — CBC
HCT: 33.5 % — ABNORMAL LOW (ref 36.0–46.0)
MCV: 88.4 fL (ref 78.0–100.0)
RDW: 13.7 % (ref 11.5–15.5)
WBC: 11.5 10*3/uL — ABNORMAL HIGH (ref 4.0–10.5)

## 2013-01-15 LAB — URINALYSIS, ROUTINE W REFLEX MICROSCOPIC
Bilirubin Urine: NEGATIVE
Hgb urine dipstick: NEGATIVE
Specific Gravity, Urine: >1.03 — ABNORMAL HIGH (ref 1.005–1.030)
Urobilinogen, UA: 0.2 mg/dL (ref 0.0–1.0)
pH: 6 (ref 5.0–8.0)

## 2013-01-15 MED ORDER — ONDANSETRON 4 MG PO TBDP
4.0000 mg | ORAL_TABLET | Freq: Once | ORAL | Status: AC
  Administered 2013-01-15: 4 mg via ORAL
  Filled 2013-01-15: qty 1

## 2013-01-15 MED ORDER — LACTATED RINGERS IV BOLUS (SEPSIS)
1000.0000 mL | Freq: Once | INTRAVENOUS | Status: AC
  Administered 2013-01-15: 1000 mL via INTRAVENOUS

## 2013-01-15 NOTE — MAU Provider Note (Signed)
History     CSN: [id number]  Arrival date & time 01/15/13  1854   None     Chief Complaint  Patient presents with  . Morning Sickness    HPI  Ana Mejia is a 22 y.o. G3P2002 at unknown gestational age who presents complaining of nausea and vomiting.  Today she felt ill and threw up four times. Says she cannot keep anything down. Has pain on both sides. She has an rx for zofran but her medicaid won't pay for it so she hasn't been able to get it filled. Has been getting dizzy and has had a headache that won't go away, also has been seeing black spots. She thought the headache was from being really busy (she works in [employer], political party). Has tried taking tylenol twice. The dizziness and spots just came today. If she gets up even slowly, gets a spinning sensation and sees spots/stars.  No passing out. Has had shakes but not been cold. Has noticed swelling in her hands and feet. Has not noticed her nose getting wider. Has had some RUQ pain for the past 2 weeks which she thought was from the baby. Has never had problems with her BP before. It was elevated at [facility] 3 weeks ago briefly but improved to normal in the same encounter.   Patient reports that she did not know she was pregnant until April 2, when she presented to [facility] with a viral gastroenteritis. She does not know when her LMP was, but it may have been around Thanksgiving. At [facility] they measured her uterus at 33 weeks but did not do an ultrasound. Pt had delivered a baby on June 29 here and was breastfeeding but not on any birth control, and had no idea she was pregnant. She has not established [facility] yet, due to problems with her medicaid. She has regular medicaid and tried to go to the health department but was told she couldn't be seen until she gets pregnancy medicaid.   Denies any gushing fluid but has had to change her pantiliner >6 times per day. Sometimes it soaks through. Wasn't sure if she urinated on herself. This has  happened for about a week. No bleeding. + fetal movement. Feels contractions like period cramps a few times per day.    Past Medical History  Diagnosis Date  . Headache   . Depression     ppd  . PTSD (post-traumatic stress disorder)     Past Surgical History  Procedure Laterality Date  . Tonsillectomy    . Wisdom tooth extraction    . Tympanostomy tube placement      x3    Family History  Problem Relation Age of Onset  . Hypertension Mother   . Autism Sister   . Thyroid disease Sister   . Hypertension Maternal Grandmother   . Peripheral vascular disease Maternal Grandmother   . Diabetes Maternal Grandmother   . Cancer Maternal Grandmother     breast  . Peripheral vascular disease Maternal Grandfather   . Heart disease Paternal Grandfather     History  Substance Use Topics  . Smoking status: Former Smoker -- 1.00 packs/day    Quit date: 08/01/2011  . Smokeless tobacco: Not on file  . Alcohol Use: No  no smoke, no alcohol, no drugs Used to be a heavy smoker Quit Aug 04 2011  OB History   Grav Para Term Preterm Abortions TAB SAB Ect Mult Living   3 2 2  2    two kids are doing well No problems during preg or del last time Vaginal deliveries - first born at [facility], second born here  Review of Systems no gush of fluid. +vomiting. No bleeding. + fetal movement.  Allergies  Other - carrots, tongue swells  Home Medications  No current outpatient prescriptions on file. Meds: prenatal vitamins (was taking because nursing) Tylenol prn  BP 109/76  Pulse 96  Temp(Src) 98.3 F (36.8 C) (Oral)  Resp 20  Ht 5\' 4"  (1.626 m)  Wt 207 lb 6 oz (94.065 kg)  BMI 35.58 kg/m2  LMP 08/23/2012  Physical Exam Gen: NAD, very pleasant and cooperative HEENT: mouth slightly dry Heart: RRR Lungs: CTAB, NWOB Abd: gravid but otherwise soft, nontender to palpation Ext: no appreciable lower extremity edema bilaterally Neuro: grossly nonfocal, speech  intact GU: normal appearing external genitalia. Thick whitish discharge in vagina. No pooling of clear fluid. Dilation: Closed  MAU Course  Procedures (including critical care time)  Labs Reviewed  WET PREP, GENITAL - Abnormal; Notable for the following:    WBC, Wet Prep HPF POC MANY (*)    All other components within normal limits  COMPREHENSIVE METABOLIC PANEL - Abnormal; Notable for the following:    Creatinine, Ser 0.38 (*)    Albumin 2.9 (*)    All other components within normal limits  CBC - Abnormal; Notable for the following:    WBC 11.5 (*)    RBC 3.79 (*)    Hemoglobin 11.1 (*)    HCT 33.5 (*)    All other components within normal limits  URINALYSIS, ROUTINE W REFLEX MICROSCOPIC - Abnormal; Notable for the following:    APPearance CLOUDY (*)    Specific Gravity, Urine >1.030 (*)    Ketones, ur 15 (*)    Leukocytes, UA TRACE (*)    All other components within normal limits  URINE MICROSCOPIC-ADD ON - Abnormal; Notable for the following:    Squamous Epithelial / LPF FEW (*)    Bacteria, UA MANY (*)    All other components within normal limits  URINE CULTURE  GC/CHLAMYDIA PROBE AMP   No results found.   1. Vomiting complicating pregnancy     MDM  Ana Mejia is a 22 y.o. G3P2002 at of unknown gestational age who presents with n/v, headache, vision changes.   CMET and CBC unremarkable. UA shows possible but not certain UTI, will send for culture. No proteinuria. BP not elevated here to suggest pre-eclampsia or gestational hypertension. Discharge likely normal vaginal discharge of pregnancy. Wet prep umremarkable, but will send gc/chlamydia.  Gave 1L bolus of LR and zofran 4mg , after which patient reported feeling much better. Have ordered outpatient complete OB ultrasound for assistance in establishing dates. Will get her established in clinic here for prenatal care.  Levert Feinstein, MD Family Medicine PGY-1  I have seen and examined this patient and I  agree with the above. FHR 125 + accels, no decels- cat 1 tracing without ctx on toco. FH measuring approx high 20s cm but difficult to assess accurately due to obese abd. Outpt [place] in place. Pt feeling better after fluids. Cam Hai 8:11 AM 01/16/2013

## 2013-01-15 NOTE — MAU Note (Addendum)
PT SAYS  SHE VOMITED AT 0645-  TODAY   TOTAL 4X.         SHE WENT TO [facility] ON 4-2- HAD A VIRUS- AND SHE FOUND OUT THEN SHE WAS PREG.      SHE HAS BEEN TAKING PNV-    SHE TRIED TO GET REG MEDICAID  SWITCHED TO PREG MEDICAID  AND COULD NOT-  THEREFORE SHE  HAS NOT   SEEN A DR.   .     DENIES HSV AND MRSA.  IN FEB- SHE WAS STILL BREASTFEEDING HER BABY-   VAG DEL IN 03-24-2012-  THAT [facility]- WAS WITH HD.

## 2013-01-16 DIAGNOSIS — O219 Vomiting of pregnancy, unspecified: Secondary | ICD-10-CM

## 2013-01-16 LAB — GC/CHLAMYDIA PROBE AMP
CT Probe RNA: NEGATIVE
GC Probe RNA: NEGATIVE

## 2013-01-16 LAB — URINE CULTURE

## 2013-01-16 LAB — OB RESULTS CONSOLE GC/CHLAMYDIA
Chlamydia: NEGATIVE
Gonorrhea: NEGATIVE

## 2013-01-16 LAB — WET PREP, GENITAL: Clue Cells Wet Prep HPF POC: NONE SEEN

## 2013-01-16 MED ORDER — PROMETHAZINE HCL 12.5 MG PO TABS: 12.5000 mg | ORAL_TABLET | Freq: Four times a day (QID) | ORAL | Status: DC | PRN

## 2013-01-16 MED ORDER — ONDANSETRON HCL 4 MG PO TABS: 4.0000 mg | ORAL_TABLET | Freq: Three times a day (TID) | ORAL | Status: DC | PRN

## 2013-01-17 ENCOUNTER — Ambulatory Visit (HOSPITAL_COMMUNITY)
Admission: RE | Admit: 2013-01-17 | Discharge: 2013-01-17 | Disposition: A | Discharge status: Home / Self Care | Payer: Medicaid Other | Source: Ambulatory Visit | Attending: Obstetrics & Gynecology | Admitting: Obstetrics & Gynecology

## 2013-01-17 ENCOUNTER — Other Ambulatory Visit (HOSPITAL_COMMUNITY): Payer: Self-pay | Admitting: Family Medicine

## 2013-01-17 DIAGNOSIS — O219 Vomiting of pregnancy, unspecified: Secondary | ICD-10-CM

## 2013-01-17 DIAGNOSIS — Z3689 Encounter for other specified antenatal screening: Secondary | ICD-10-CM | POA: Insufficient documentation

## 2013-01-17 NOTE — MAU Provider Note (Signed)
Attestation of Attending Supervision of Advanced Practitioner (PA/CNM/NP): Evaluation and management procedures were performed by the Advanced Practitioner under my supervision and collaboration.  I have reviewed the Advanced Practitioner's note and chart, and I agree with the management and plan.  Amelie Caracci, MD, FACOG Attending Obstetrician & Gynecologist Faculty Practice, [facility]  

## 2013-01-18 ENCOUNTER — Encounter (HOSPITAL_COMMUNITY): Payer: Self-pay | Admitting: Obstetrics & Gynecology

## 2013-02-04 ENCOUNTER — Encounter: Payer: Medicaid Other | Admitting: Obstetrics & Gynecology

## 2013-02-10 ENCOUNTER — Encounter (HOSPITAL_COMMUNITY): Payer: Self-pay | Admitting: *Deleted

## 2013-02-10 ENCOUNTER — Inpatient Hospital Stay (HOSPITAL_COMMUNITY)
Admission: AD | Admit: 2013-02-10 | Discharge: 2013-02-10 | Disposition: A | Discharge status: Home / Self Care | Payer: Medicaid Other | Source: Ambulatory Visit | Attending: Obstetrics & Gynecology | Admitting: Obstetrics & Gynecology

## 2013-02-10 DIAGNOSIS — O4700 False labor before 37 completed weeks of gestation, unspecified trimester: Secondary | ICD-10-CM | POA: Insufficient documentation

## 2013-02-10 DIAGNOSIS — O99891 Other specified diseases and conditions complicating pregnancy: Secondary | ICD-10-CM | POA: Insufficient documentation

## 2013-02-10 DIAGNOSIS — M549 Dorsalgia, unspecified: Secondary | ICD-10-CM | POA: Insufficient documentation

## 2013-02-10 DIAGNOSIS — O26899 Other specified pregnancy related conditions, unspecified trimester: Secondary | ICD-10-CM

## 2013-02-10 LAB — URINE MICROSCOPIC-ADD ON

## 2013-02-10 LAB — URINALYSIS, ROUTINE W REFLEX MICROSCOPIC
Ketones, ur: 40 mg/dL — AB
Protein, ur: NEGATIVE mg/dL
Urobilinogen, UA: 1 mg/dL (ref 0.0–1.0)

## 2013-02-10 MED ORDER — CYCLOBENZAPRINE HCL 10 MG PO TABS: 10.0000 mg | ORAL_TABLET | Freq: Three times a day (TID) | ORAL | Status: DC | PRN

## 2013-02-10 MED ORDER — CYCLOBENZAPRINE HCL 10 MG PO TABS
10.0000 mg | ORAL_TABLET | Freq: Once | ORAL | Status: AC
  Administered 2013-02-10: 10 mg via ORAL
  Filled 2013-02-10: qty 1

## 2013-02-10 NOTE — MAU Provider Note (Signed)
History     CSN: [id number]  Arrival date and time: 02/10/13 2001   First Provider Initiated Contact with Patient 02/10/13 2059      Chief Complaint  Patient presents with  . Rupture of Membranes  . Labor Eval   HPI This is a 22 y.o. female at [redacted]w[redacted]d who presents with c/o back pain.  Also leaking small amount of fluid. Wants to make sure she is not in labor.  Hurts more with contractions. States has had back labor with every baby.   RN Note: Pt states she is having back pain all day today and it has progressively worsened also having contractions. Denies bleeding, reports ? Leaking fluid since yesterday      OB History   Grav Para Term Preterm Abortions TAB SAB Ect Mult Living   3 2 2       2       Past Medical History  Diagnosis Date  . Headache   . Depression     ppd  . PTSD (post-traumatic stress disorder)     Past Surgical History  Procedure Laterality Date  . Tonsillectomy    . Wisdom tooth extraction    . Tympanostomy tube placement      x3    Family History  Problem Relation Age of Onset  . Hypertension Mother   . Autism Sister   . Thyroid disease Sister   . Hypertension Maternal Grandmother   . Peripheral vascular disease Maternal Grandmother   . Diabetes Maternal Grandmother   . Cancer Maternal Grandmother     breast  . Peripheral vascular disease Maternal Grandfather   . Heart disease Paternal Grandfather     History  Substance Use Topics  . Smoking status: Former Smoker -- 1.00 packs/day    Quit date: 08/01/2011  . Smokeless tobacco: Not on file  . Alcohol Use: No    Allergies:  Allergies  Allergen Reactions  . Other Swelling    Carrots....throat  swells    Prescriptions prior to admission  Medication Sig Dispense Refill  . acetaminophen (TYLENOL) 325 MG tablet Take 650 mg by mouth every 6 (six) hours as needed for pain.      . Prenatal Vit-Fe Fumarate-FA (PRENATAL MULTIVITAMIN) TABS Take 1 tablet by mouth every morning.      .  promethazine (PHENERGAN) 12.5 MG tablet Take 1 tablet (12.5 mg total) by mouth every 6 (six) hours as needed for nausea.  20 tablet  0    Review of Systems  Constitutional: Negative for fever, chills and malaise/fatigue.  Gastrointestinal: Positive for abdominal pain. Negative for nausea, vomiting, diarrhea and constipation.  Genitourinary: Negative for dysuria.  Musculoskeletal: Positive for back pain.  Neurological: Negative for dizziness, weakness and headaches.   Physical Exam   Blood pressure 132/80, pulse 92, temperature 98.6 F (37 C), temperature source Oral, resp. rate 18, height 5\' 5"  (1.651 m), weight 210 lb (95.255 kg), last menstrual period 08/23/2012, SpO2 100.00%.  Physical Exam  Constitutional: She is oriented to person, place, and time. She appears well-developed and well-nourished. No distress.  HENT:  Head: Normocephalic.  Cardiovascular: Normal rate.   Respiratory: Effort normal.  GI: Soft. She exhibits no distension and no mass. There is no tenderness. There is no rebound and no guarding.  Genitourinary: Vagina normal and uterus normal. No vaginal discharge found.  No pooling, no ferning  Musculoskeletal: Normal range of motion.  Neurological: She is alert and oriented to person, place, and time.  Skin: Skin  is warm and dry.  Psychiatric: She has a normal mood and affect.   Fetal heart rate reactive, no decels, + accels, Category I Irregular contractions MAU Course  Procedures   Assessment and Plan  A:  SIUP at [redacted]w[redacted]d        Back pain, probably related to contractions but could be strain/spasm due to moving activities      No evidence ROM P:  Discharge home      Flexeril for back pain      Keep appt as scheduled  [facility] 02/10/2013, 9:16 PM

## 2013-02-10 NOTE — MAU Note (Signed)
Pt lower back reports back pain all day. Pt took tylenol x2 today with no relief, last dose @ 1800.

## 2013-02-10 NOTE — MAU Note (Signed)
Pt states she is having back pain all day today and it has progressively worsened also having contractions. Denies bleeding, reports ? Leaking fluid since yesterday

## 2013-02-12 LAB — URINE CULTURE

## 2013-03-11 ENCOUNTER — Encounter (HOSPITAL_COMMUNITY): Payer: Self-pay | Admitting: *Deleted

## 2013-03-11 ENCOUNTER — Telehealth (HOSPITAL_COMMUNITY): Payer: Self-pay | Admitting: *Deleted

## 2013-03-11 ENCOUNTER — Inpatient Hospital Stay (HOSPITAL_COMMUNITY)
Admission: AD | Admit: 2013-03-11 | Discharge: 2013-03-11 | Disposition: A | Discharge status: Home / Self Care | Payer: Medicaid Other | Source: Ambulatory Visit | Attending: Obstetrics & Gynecology | Admitting: Obstetrics & Gynecology

## 2013-03-11 ENCOUNTER — Inpatient Hospital Stay (HOSPITAL_COMMUNITY): Payer: Medicaid Other

## 2013-03-11 DIAGNOSIS — Z298 Encounter for other specified prophylactic measures: Secondary | ICD-10-CM | POA: Insufficient documentation

## 2013-03-11 DIAGNOSIS — O0933 Supervision of pregnancy with insufficient antenatal care, third trimester: Secondary | ICD-10-CM

## 2013-03-11 DIAGNOSIS — O368190 Decreased fetal movements, unspecified trimester, not applicable or unspecified: Secondary | ICD-10-CM | POA: Insufficient documentation

## 2013-03-11 DIAGNOSIS — Z2989 Encounter for other specified prophylactic measures: Secondary | ICD-10-CM | POA: Insufficient documentation

## 2013-03-11 DIAGNOSIS — O480 Post-term pregnancy: Secondary | ICD-10-CM | POA: Insufficient documentation

## 2013-03-11 DIAGNOSIS — O0930 Supervision of pregnancy with insufficient antenatal care, unspecified trimester: Secondary | ICD-10-CM | POA: Insufficient documentation

## 2013-03-11 DIAGNOSIS — O4700 False labor before 37 completed weeks of gestation, unspecified trimester: Secondary | ICD-10-CM | POA: Insufficient documentation

## 2013-03-11 HISTORY — DX: Unspecified infectious disease: B99.9

## 2013-03-11 LAB — RAPID URINE DRUG SCREEN, HOSP PERFORMED
Amphetamines: NOT DETECTED
Barbiturates: NOT DETECTED
Tetrahydrocannabinol: NOT DETECTED

## 2013-03-11 LAB — CBC
MCV: 84.4 fL (ref 78.0–100.0)
Platelets: 250 10*3/uL (ref 150–400)
RBC: 3.97 MIL/uL (ref 3.87–5.11)
WBC: 8.8 10*3/uL (ref 4.0–10.5)

## 2013-03-11 LAB — DIFFERENTIAL
Eosinophils Relative: 1 % (ref 0–5)
Lymphocytes Relative: 20 % (ref 12–46)
Lymphs Abs: 1.8 10*3/uL (ref 0.7–4.0)

## 2013-03-11 LAB — RAPID HIV SCREEN (WH-MAU): Rapid HIV Screen: NONREACTIVE

## 2013-03-11 LAB — HEPATITIS B SURFACE ANTIGEN: Hepatitis B Surface Ag: NEGATIVE

## 2013-03-11 LAB — OB RESULTS CONSOLE HIV ANTIBODY (ROUTINE TESTING): HIV: NONREACTIVE

## 2013-03-11 MED ORDER — RHO D IMMUNE GLOBULIN 1500 UNIT/2ML IJ SOLN
300.0000 ug | Freq: Once | INTRAMUSCULAR | Status: AC
  Administered 2013-03-11: 300 ug via INTRAMUSCULAR
  Filled 2013-03-11: qty 2

## 2013-03-11 NOTE — MAU Note (Signed)
Scattered care due to work situation.

## 2013-03-11 NOTE — MAU Note (Signed)
Was contracting last night from 2300-0330, then went to sleep.  Feeling a lot of pressure this morning.  Some contractions.  No bleeding or leaking.

## 2013-03-11 NOTE — Telephone Encounter (Signed)
Preadmission screen  

## 2013-03-11 NOTE — MAU Provider Note (Signed)
Chief Complaint:  Labor Eval   First Provider Initiated Contact with Patient 03/11/13 0815     HPI: Ana Mejia is a 22 y.o. G3P2002 at [redacted]w[redacted]d by 32 week [place] who presents to maternity admissions reporting contractions and decreased FM. Denies leakage of fluid or vaginal bleeding. No prenatal care due to work situation, finding out that she was pregnant 2 months ago. Gave birth one year ago. Was breastfeeding. No periods. Has not had prenatal labs. Anatomy scan done at 32.5 weeks. Normal, but limited by advanced gestation.   Past Medical History: Past Medical History  Diagnosis Date  . Headache(784.0)   . PTSD (post-traumatic stress disorder)   . Infection     UTI  . Depression     ppd after first (financial stress)    Past obstetric history: OB History   Grav Para Term Preterm Abortions TAB SAB Ect Mult Living   3 2 2       2      # Outc Date GA Lbr Len/2nd Wgt Sex Del Anes PTL Lv   1 TRM 2011 [redacted]w[redacted]d 21:00 2.92kg(6lb7oz) M SVD Other  Yes   Comments: unsure if epidural or spinal states she was 9 cm   2 TRM 6/13 [redacted]w[redacted]d 12:10 / 00:26 3.275kg(7lb3.5oz) F SVD EPI  Yes   Comments: WNL   3 CUR               Past Surgical History: Past Surgical History  Procedure Laterality Date  . Tonsillectomy    . Wisdom tooth extraction    . Tympanostomy tube placement      x3    Family History: Family History  Problem Relation Age of Onset  . Hypertension Mother   . Autism Sister   . Thyroid disease Sister   . Hypertension Maternal Grandmother   . Peripheral vascular disease Maternal Grandmother   . Diabetes Maternal Grandmother   . Cancer Maternal Grandmother     breast  . Peripheral vascular disease Maternal Grandfather   . Heart disease Paternal Grandfather     Social History: History  Substance Use Topics  . Smoking status: Former Smoker -- 1.00 packs/day    Quit date: 08/01/2011  . Smokeless tobacco: Never Used  . Alcohol Use: No    Allergies:  Allergies  Allergen  Reactions  . Carrot Flavor (Flavoring Agent) Anaphylaxis    Meds:  Prescriptions prior to admission  Medication Sig Dispense Refill  . acetaminophen (TYLENOL) 325 MG tablet Take 650 mg by mouth every 6 (six) hours as needed for pain.      . cyclobenzaprine (FLEXERIL) 10 MG tablet Take 1 tablet (10 mg total) by mouth every 8 (eight) hours as needed for muscle spasms.  30 tablet  1  . Prenatal Vit-Fe Fumarate-FA (PRENATAL MULTIVITAMIN) TABS Take 1 tablet by mouth every morning.        ROS: Pertinent findings in history of present illness.  Physical Exam  Blood pressure 121/80, pulse 100, temperature 98.3 F (36.8 C), temperature source Oral, resp. rate 18, height 5' 4.5" (1.638 m), weight 97.07 kg (214 lb), last menstrual period 08/23/2012. GENERAL: Well-developed, well-nourished female in no acute distress.  HEENT: normocephalic HEART: normal rate RESP: normal effort ABDOMEN: Soft, non-tender, gravid appropriate for gestational age EXTREMITIES: Nontender, no edema NEURO: alert and oriented SPECULUM EXAM: Deferred. Dilation: 2.5 Effacement (%): Thick Cervical Position: Middle Station: -2 Presentation: Vertex Exam by:: jolynn   FHT:  Baseline 150 , minimal-moderate variability, 5x5 accelerations present, no  decelerations Contractions: q 5-10 mins. mild  Assessment: Prodromal labor. NR NST. Decreased FM. No prenatal care. Dating by third trimester [place].  Plan: BPP, AFI, prenatal panel ordered.    Care of pt turned over to Joseph Berkshire, [place] at [phone].   Dorathy Kinsman, CNM 03/11/2013 8:32 AM    Results for orders placed during the hospital encounter of 03/11/13 (from the past 24 hour(s))  HEPATITIS B SURFACE ANTIGEN     Status: None   Collection Time    03/11/13  9:30 AM      Result Value Range   Hepatitis B Surface Ag NEGATIVE  NEGATIVE  RPR     Status: None   Collection Time    03/11/13  9:30 AM      Result Value Range   RPR NON REACTIVE  NON REACTIVE  CBC      Status: Abnormal   Collection Time    03/11/13  9:30 AM      Result Value Range   WBC 8.8  4.0 - 10.5 K/uL   RBC 3.97  3.87 - 5.11 MIL/uL   Hemoglobin 11.1 (*) 12.0 - 15.0 g/dL   HCT 96.0 (*) 45.4 - 09.8 %   MCV 84.4  78.0 - 100.0 fL   MCH 28.0  26.0 - 34.0 pg   MCHC 33.1  30.0 - 36.0 g/dL   RDW 11.9  14.7 - 82.9 %   Platelets 250  150 - 400 K/uL  DIFFERENTIAL     Status: None   Collection Time    03/11/13  9:30 AM      Result Value Range   Neutrophils Relative % 73  43 - 77 %   Neutro Abs 6.5  1.7 - 7.7 K/uL   Lymphocytes Relative 20  12 - 46 %   Lymphs Abs 1.8  0.7 - 4.0 K/uL   Monocytes Relative 6  3 - 12 %   Monocytes Absolute 0.6  0.1 - 1.0 K/uL   Eosinophils Relative 1  0 - 5 %   Eosinophils Absolute 0.0  0.0 - 0.7 K/uL   Basophils Relative 0  0 - 1 %   Basophils Absolute 0.0  0.0 - 0.1 K/uL  RAPID HIV SCREEN ([facility]MAU)     Status: None   Collection Time    03/11/13  9:30 AM      Result Value Range   SUDS Rapid HIV Screen NON REACTIVE  NON REACTIVE  TYPE AND SCREEN     Status: None   Collection Time    03/11/13  9:30 AM      Result Value Range   ABO/RH(D) B NEG     Antibody Screen NEG     Sample Expiration 03/14/2013    RH IG WORKUP (INCLUDES ABO/RH)     Status: None   Collection Time    03/11/13  9:30 AM      Result Value Range   Gestational Age(Wks) 40.2     ABO/RH(D) B NEG     Unit Number [id number]     Blood Component Type RHIG     Unit division 00     Status of Unit ISSUED     Transfusion Status OK TO TRANSFUSE    URINE RAPID DRUG SCREEN (HOSP PERFORMED)     Status: None   Collection Time    03/11/13 10:58 AM      Result Value Range   Opiates NONE DETECTED  NONE DETECTED   Cocaine NONE DETECTED  NONE DETECTED   Benzodiazepines NONE DETECTED  NONE DETECTED   Amphetamines NONE DETECTED  NONE DETECTED   Tetrahydrocannabinol NONE DETECTED  NONE DETECTED   Barbiturates NONE DETECTED  NONE DETECTED    U/S: BPP 8/8, AFI 19 NST reactive after return  from u/s  Cervix rechecked - 3/50/-2/membranes swept per patient request  A/P: 1. Post term pregnancy   2. Insufficient prenatal care, third trimester   IOL scheduled for 6/21 at 41 weeks. Rev'd precautions. OB labs drawn today, GBS collected, Rhogam given today.     Medication List    TAKE these medications       acetaminophen 325 MG tablet  Commonly known as:  TYLENOL  Take 650 mg by mouth every 6 (six) hours as needed for pain.     cyclobenzaprine 10 MG tablet  Commonly known as:  FLEXERIL  Take 1 tablet (10 mg total) by mouth every 8 (eight) hours as needed for muscle spasms.     prenatal multivitamin Tabs  Take 1 tablet by mouth every morning.            Follow-up Information   Follow up with [facility] On 03/16/2013. (8:00 AM)    Contact information:   [address] [phone] [place] [address] [phone]

## 2013-03-11 NOTE — MAU Note (Signed)
Medicaid card mix up in Dec.  Only had 2 appts at health dept.  Did have one [place].

## 2013-03-12 LAB — RH IG WORKUP (INCLUDES ABO/RH)
ABO/RH(D): B NEG
Gestational Age(Wks): 40.2

## 2013-03-12 NOTE — MAU Provider Note (Signed)
Attestation of Attending Supervision of Advanced Practitioner (PA/CNM/NP): Evaluation and management procedures were performed by the Advanced Practitioner under my supervision and collaboration.  I have reviewed the Advanced Practitioner's note and chart, and I agree with the management and plan.  Angelik Walls, MD, FACOG Attending Obstetrician & Gynecologist Faculty Practice, [facility]  

## 2013-03-13 ENCOUNTER — Inpatient Hospital Stay (HOSPITAL_COMMUNITY): Payer: Medicaid Other | Admitting: Anesthesiology

## 2013-03-13 ENCOUNTER — Encounter (HOSPITAL_COMMUNITY): Payer: Self-pay | Admitting: Anesthesiology

## 2013-03-13 ENCOUNTER — Encounter (HOSPITAL_COMMUNITY): Payer: Self-pay | Admitting: *Deleted

## 2013-03-13 ENCOUNTER — Inpatient Hospital Stay (HOSPITAL_COMMUNITY)
Admission: AD | Admit: 2013-03-13 | Discharge: 2013-03-15 | DRG: 775 | Disposition: A | Discharge status: Home / Self Care | Payer: Medicaid Other | Source: Ambulatory Visit | Attending: Obstetrics and Gynecology | Admitting: Obstetrics and Gynecology

## 2013-03-13 DIAGNOSIS — O0930 Supervision of pregnancy with insufficient antenatal care, unspecified trimester: Secondary | ICD-10-CM

## 2013-03-13 DIAGNOSIS — O289 Unspecified abnormal findings on antenatal screening of mother: Secondary | ICD-10-CM

## 2013-03-13 LAB — CBC
Hemoglobin: 10.8 g/dL — ABNORMAL LOW (ref 12.0–15.0)
MCH: 27.8 pg (ref 26.0–34.0)
MCHC: 33.2 g/dL (ref 30.0–36.0)
Platelets: 235 10*3/uL (ref 150–400)
RDW: 14.3 % (ref 11.5–15.5)

## 2013-03-13 LAB — TYPE AND SCREEN: ABO/RH(D): B NEG

## 2013-03-13 LAB — CULTURE, BETA STREP (GROUP B ONLY)

## 2013-03-13 LAB — RPR: RPR Ser Ql: NONREACTIVE

## 2013-03-13 MED ORDER — WITCH HAZEL-GLYCERIN EX PADS: 1.0000 "application " | MEDICATED_PAD | CUTANEOUS | Status: DC | PRN

## 2013-03-13 MED ORDER — LACTATED RINGERS IV SOLN
INTRAVENOUS | Status: DC
  Administered 2013-03-13 (×2): via INTRAVENOUS

## 2013-03-13 MED ORDER — DIBUCAINE 1 % RE OINT: 1.0000 "application " | TOPICAL_OINTMENT | RECTAL | Status: DC | PRN

## 2013-03-13 MED ORDER — EPHEDRINE 5 MG/ML INJ
10.0000 mg | INTRAVENOUS | Status: DC | PRN
  Filled 2013-03-13: qty 2

## 2013-03-13 MED ORDER — LIDOCAINE HCL (PF) 1 % IJ SOLN
INTRAMUSCULAR | Status: DC | PRN
  Administered 2013-03-13: 9 mL
  Administered 2013-03-13: 7 mL

## 2013-03-13 MED ORDER — OXYCODONE-ACETAMINOPHEN 5-325 MG PO TABS
1.0000 | ORAL_TABLET | ORAL | Status: DC | PRN
  Administered 2013-03-14 – 2013-03-15 (×5): 1 via ORAL
  Filled 2013-03-13 (×5): qty 1

## 2013-03-13 MED ORDER — FENTANYL 2.5 MCG/ML BUPIVACAINE 1/10 % EPIDURAL INFUSION (WH - ANES)
INTRAMUSCULAR | Status: DC | PRN
  Administered 2013-03-13: 14 mL/h via EPIDURAL

## 2013-03-13 MED ORDER — BENZOCAINE-MENTHOL 20-0.5 % EX AERO: 1.0000 "application " | INHALATION_SPRAY | CUTANEOUS | Status: DC | PRN

## 2013-03-13 MED ORDER — FLEET ENEMA 7-19 GM/118ML RE ENEM: 1.0000 | ENEMA | RECTAL | Status: DC | PRN

## 2013-03-13 MED ORDER — ZOLPIDEM TARTRATE 5 MG PO TABS: 5.0000 mg | ORAL_TABLET | Freq: Every evening | ORAL | Status: DC | PRN

## 2013-03-13 MED ORDER — DIPHENHYDRAMINE HCL 25 MG PO CAPS: 25.0000 mg | ORAL_CAPSULE | Freq: Four times a day (QID) | ORAL | Status: DC | PRN

## 2013-03-13 MED ORDER — IBUPROFEN 600 MG PO TABS
600.0000 mg | ORAL_TABLET | Freq: Four times a day (QID) | ORAL | Status: DC
  Administered 2013-03-13 – 2013-03-15 (×7): 600 mg via ORAL
  Filled 2013-03-13 (×7): qty 1

## 2013-03-13 MED ORDER — OXYTOCIN 40 UNITS IN LACTATED RINGERS INFUSION - SIMPLE MED
62.5000 mL/h | INTRAVENOUS | Status: DC
  Administered 2013-03-13: 62.5 mL/h via INTRAVENOUS
  Filled 2013-03-13: qty 1000

## 2013-03-13 MED ORDER — EPHEDRINE 5 MG/ML INJ
10.0000 mg | INTRAVENOUS | Status: DC | PRN
  Filled 2013-03-13: qty 2
  Filled 2013-03-13: qty 4

## 2013-03-13 MED ORDER — SIMETHICONE 80 MG PO CHEW: 80.0000 mg | CHEWABLE_TABLET | ORAL | Status: DC | PRN

## 2013-03-13 MED ORDER — LACTATED RINGERS IV SOLN
500.0000 mL | Freq: Once | INTRAVENOUS | Status: AC
  Administered 2013-03-13: 500 mL via INTRAVENOUS

## 2013-03-13 MED ORDER — PHENYLEPHRINE 40 MCG/ML (10ML) SYRINGE FOR IV PUSH (FOR BLOOD PRESSURE SUPPORT)
80.0000 ug | PREFILLED_SYRINGE | INTRAVENOUS | Status: DC | PRN
  Filled 2013-03-13: qty 2

## 2013-03-13 MED ORDER — PRENATAL MULTIVITAMIN CH
1.0000 | ORAL_TABLET | Freq: Every day | ORAL | Status: DC
  Administered 2013-03-14 – 2013-03-15 (×2): 1 via ORAL
  Filled 2013-03-13 (×2): qty 1

## 2013-03-13 MED ORDER — ACETAMINOPHEN 325 MG PO TABS
650.0000 mg | ORAL_TABLET | ORAL | Status: DC | PRN
  Administered 2013-03-13: 650 mg via ORAL
  Filled 2013-03-13: qty 2

## 2013-03-13 MED ORDER — OXYTOCIN BOLUS FROM INFUSION: 500.0000 mL | INTRAVENOUS | Status: DC

## 2013-03-13 MED ORDER — DIPHENHYDRAMINE HCL 50 MG/ML IJ SOLN: 12.5000 mg | INTRAMUSCULAR | Status: DC | PRN

## 2013-03-13 MED ORDER — FENTANYL 2.5 MCG/ML BUPIVACAINE 1/10 % EPIDURAL INFUSION (WH - ANES)
14.0000 mL/h | INTRAMUSCULAR | Status: DC | PRN
  Filled 2013-03-13: qty 125

## 2013-03-13 MED ORDER — FENTANYL CITRATE 0.05 MG/ML IJ SOLN
100.0000 ug | INTRAMUSCULAR | Status: DC | PRN
  Administered 2013-03-13: 100 ug via INTRAVENOUS
  Filled 2013-03-13: qty 2

## 2013-03-13 MED ORDER — ONDANSETRON HCL 4 MG PO TABS: 4.0000 mg | ORAL_TABLET | ORAL | Status: DC | PRN

## 2013-03-13 MED ORDER — ONDANSETRON HCL 4 MG/2ML IJ SOLN: 4.0000 mg | Freq: Four times a day (QID) | INTRAMUSCULAR | Status: DC | PRN

## 2013-03-13 MED ORDER — CITRIC ACID-SODIUM CITRATE 334-500 MG/5ML PO SOLN: 30.0000 mL | ORAL | Status: DC | PRN

## 2013-03-13 MED ORDER — IBUPROFEN 600 MG PO TABS: 600.0000 mg | ORAL_TABLET | Freq: Four times a day (QID) | ORAL | Status: DC | PRN

## 2013-03-13 MED ORDER — LACTATED RINGERS IV SOLN: 500.0000 mL | INTRAVENOUS | Status: DC | PRN

## 2013-03-13 MED ORDER — OXYCODONE-ACETAMINOPHEN 5-325 MG PO TABS: 1.0000 | ORAL_TABLET | ORAL | Status: DC | PRN

## 2013-03-13 MED ORDER — LANOLIN HYDROUS EX OINT: TOPICAL_OINTMENT | CUTANEOUS | Status: DC | PRN

## 2013-03-13 MED ORDER — PHENYLEPHRINE 40 MCG/ML (10ML) SYRINGE FOR IV PUSH (FOR BLOOD PRESSURE SUPPORT)
80.0000 ug | PREFILLED_SYRINGE | INTRAVENOUS | Status: DC | PRN
  Filled 2013-03-13: qty 2
  Filled 2013-03-13: qty 5

## 2013-03-13 MED ORDER — ONDANSETRON HCL 4 MG/2ML IJ SOLN
4.0000 mg | INTRAMUSCULAR | Status: DC | PRN
Start: 2013-03-13 — End: 2013-03-15

## 2013-03-13 MED ORDER — LIDOCAINE HCL (PF) 1 % IJ SOLN
30.0000 mL | INTRAMUSCULAR | Status: DC | PRN
  Filled 2013-03-13 (×2): qty 30

## 2013-03-13 MED ORDER — SENNOSIDES-DOCUSATE SODIUM 8.6-50 MG PO TABS
2.0000 | ORAL_TABLET | Freq: Every day | ORAL | Status: DC
  Administered 2013-03-14: 2 via ORAL

## 2013-03-13 NOTE — Progress Notes (Addendum)
Mandeep Ferch is a 22 y.o. G3P2002 at [redacted]w[redacted]d    Subjective: Comfortable with epidural  Objective: BP 115/58  Pulse 71  Temp(Src) 98.1 F (36.7 C) (Oral)  Resp 18  Ht 5' 4.5" (1.638 m)  Wt 99.338 kg (219 lb)  BMI 37.02 kg/m2  SpO2 100%  LMP 08/23/2012      FHT:  FHR: 135 bpm, variability: moderate,  accelerations:  Present,  decelerations:  Absent UC:   irregular, every 3-6 minutes SVE:   Dilation: 6.5 Effacement (%): 80 Station: -2 Exam by:: shaw, cnm AROM for clear fluid  Labs: Lab Results  Component Value Date   WBC 10.8* 03/13/2013   HGB 10.8* 03/13/2013   HCT 32.5* 03/13/2013   MCV 83.8 03/13/2013   PLT 235 03/13/2013    Assessment / Plan: Protracted active labor due to decreased ctx  Hopeful that AROM will increase ctx pattern to achieve adequate labor Plan reeval of cx in 2 hrs or prn  SHAW, KIMBERLY 03/13/2013, 6:50 PM

## 2013-03-13 NOTE — H&P (Signed)
Attestation of Attending Supervision of Advanced Practitioner (CNM/NP): Evaluation and management procedures were performed by the Advanced Practitioner under my supervision and collaboration.  I have reviewed the Advanced Practitioner's note and chart, and I agree with the management and plan.  Annalee Meyerhoff 03/13/2013 4:41 PM   

## 2013-03-13 NOTE — MAU Note (Signed)
UC's for several days, worse this AM and continuing.

## 2013-03-13 NOTE — Anesthesia Preprocedure Evaluation (Signed)
Anesthesia Evaluation  Patient identified by MRN, date of birth, ID band Patient awake    Reviewed: Allergy & Precautions, H&P , NPO status , Patient's Chart, lab work & pertinent test results  Airway Mallampati: II TM Distance: >3 FB Neck ROM: full    Dental no notable dental hx.    Pulmonary neg pulmonary ROS,  breath sounds clear to auscultation  Pulmonary exam normal       Cardiovascular negative cardio ROS  Rhythm:regular Rate:Normal     Neuro/Psych    GI/Hepatic negative GI ROS, Neg liver ROS,   Endo/Other  Morbid obesity  Renal/GU negative Renal ROS  negative genitourinary   Musculoskeletal negative musculoskeletal ROS (+)   Abdominal   Peds negative pediatric ROS (+)  Hematology negative hematology ROS (+)   Anesthesia Other Findings   Reproductive/Obstetrics negative OB ROS (+) Pregnancy                           Anesthesia Physical Anesthesia Plan  ASA: III  Anesthesia Plan: Epidural   Post-op Pain Management:    Induction:   Airway Management Planned:   Additional Equipment:   Intra-op Plan:   Post-operative Plan:   Informed Consent: I have reviewed the patients History and Physical, chart, labs and discussed the procedure including the risks, benefits and alternatives for the proposed anesthesia with the patient or authorized representative who has indicated his/her understanding and acceptance.     Plan Discussed with:   Anesthesia Plan Comments:         Anesthesia Quick Evaluation

## 2013-03-13 NOTE — Anesthesia Procedure Notes (Signed)
Epidural Patient location during procedure: OB Start time: 03/13/2013 3:32 PM End time: 03/13/2013 3:37 PM  Staffing Anesthesiologist: Sandrea Hughs Performed by: anesthesiologist   Preanesthetic Checklist Completed: patient identified, surgical consent, pre-op evaluation, timeout performed, IV checked, risks and benefits discussed and monitors and equipment checked  Epidural Patient position: sitting Prep: site prepped and draped and DuraPrep Patient monitoring: continuous pulse ox and blood pressure Approach: midline Injection technique: LOR air  Needle:  Needle type: Tuohy  Needle gauge: 17 G Needle length: 9 cm and 9 Needle insertion depth: 6 cm Catheter type: closed end flexible Catheter size: 19 Gauge Catheter at skin depth: 11 cm Test dose: negative and Other  Assessment Sensory level: T9 Events: blood not aspirated, injection not painful, no injection resistance, negative IV test and no paresthesia  Additional Notes Reason for block:procedure for pain

## 2013-03-13 NOTE — H&P (Addendum)
Ana Mejia is a 22 y.o. female G3P2002 at 40.4wks by 32 wk scan presenting for SOL. Contractions began Sunday; reports 5-6 minutes apart when woke up today but have decreased to 7-8 minutes apart.  Reports small amount bleeding due to lost mucus plug.  Did not establish prenatal care due to work schedule. Maternal Medical History:  Reason for admission: Contractions.  Nausea.  Contractions: Onset was more than 2 days ago.   Frequency: regular.   Perceived severity is strong.    Fetal activity: Perceived fetal activity is normal.   Last perceived fetal movement was within the past hour.    Prenatal complications: No bleeding or hypertension.     OB History   Grav Para Term Preterm Abortions TAB SAB Ect Mult Living   3 2 2       2      Past Medical History  Diagnosis Date  . Headache(784.0)   . PTSD (post-traumatic stress disorder)   . Infection     UTI  . 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 Tonsillectomy    . Wisdom tooth extraction    . Tympanostomy tube placement      x3   Family History: family history includes Autism in her sister; Cancer in her maternal grandmother; Diabetes in her maternal grandmother; Heart disease in her paternal grandfather; Hypertension in her maternal grandmother and mother; Peripheral vascular disease in her maternal grandfather and maternal grandmother; and Thyroid disease in her sister. Social History:  reports that she quit smoking about 19 months ago. She has never used smokeless tobacco. She reports that she does not drink alcohol or use illicit drugs.   Prenatal Transfer Tool  Maternal Diabetes: No- glucola not done Genetic Screening: too late Maternal Ultrasounds/Referrals: Normal Fetal Ultrasounds or other Referrals:  None Maternal Substance Abuse:  No Significant Maternal Medications:  None Significant Maternal Lab Results:  None Other Comments:  None  Review of  Systems  Eyes: Negative for blurred vision and double vision.  Respiratory: Negative for shortness of breath.   Cardiovascular: Negative for chest pain, palpitations and leg swelling.  Gastrointestinal: Negative for nausea and vomiting. Abdominal pain: Abdominal pain from contractions; no pain in between.  Neurological: Negative for dizziness and headaches.    Dilation: 5 Effacement (%): 80 Station: -2 Exam by:: S. Carrera, RNC Blood pressure 138/70, pulse 69, temperature 98 F (36.7 C), temperature source Oral, resp. rate 18, height 5' 4.5" (1.638 m), weight 99.338 kg (219 lb), last menstrual period 08/23/2012. Exam Physical Exam  Constitutional: She is oriented to person, place, and time. She appears well-developed and well-nourished.  HENT:  Head: Normocephalic.  Neck: Normal range of motion.  Cardiovascular: Normal rate.   Respiratory: Effort normal.  Musculoskeletal: Normal range of motion.  Neurological: She is alert and oriented to person, place, and time.  Skin: Skin is warm and dry.  Psychiatric: She has a normal mood and affect. Her behavior is normal. Thought content normal.     FHR 130s, +accels, no decels, occ mi variables Ctx irreg 3-6 mins  Prenatal labs: ABO, Rh: --/--/B NEG, B NEG (06/16 0930) Antibody: NEG (06/16 0930) Rubella: 0.78 (06/16 0930) RPR: NON REACTIVE (06/16 0930)  HBsAg: NEGATIVE (06/16 0930)  HIV:   Negative GBS: Negative (06/16 0000)    Assessment/Plan: Ax: IUP at term       SOL       Early active  labor   Plan: Continue to monitor cervical changes Planning for epidural and vaginal delivery Plans to breast feed after delivery and plan for interval bilateral tubal ligation   GARLAND, JEANNA 03/13/2013, 12:45 PM  I have seen and examined this patient and I agree with the above. SHAW, KIMBERLY 1:40 PM 03/13/2013

## 2013-03-14 MED ORDER — MEASLES, MUMPS & RUBELLA VAC ~~LOC~~ INJ
0.5000 mL | INJECTION | Freq: Once | SUBCUTANEOUS | Status: DC
  Filled 2013-03-14: qty 0.5

## 2013-03-14 NOTE — Progress Notes (Addendum)
Post Partum Day 1 Subjective: no complaints, up ad lib, voiding, tolerating PO and + flatus  Objective: Blood pressure 101/69, pulse 76, temperature 98.3 F (36.8 C), temperature source Oral, resp. rate 18, height 5' 4.5" (1.638 m), weight 99.338 kg (219 lb), last menstrual period 08/23/2012, SpO2 100.00%, unknown if currently breastfeeding.  Physical Exam:  General: alert, cooperative, appears stated age and no distress Lochia: appropriate Uterine Fundus: firm Incision: NA DVT Evaluation: No evidence of DVT seen on physical exam.   Recent Labs  03/11/13 0930 03/13/13 1235  HGB 11.1* 10.8*  HCT 33.5* 32.5*    Assessment/Plan: Plan for discharge tomorrow, Breastfeeding, Social Work consult and Contraception Interval BTL.   LOS: 1 day   Ana Mejia 03/14/2013, 7:58 AM   Rhophylac not needed. Last dose 03/11/13.

## 2013-03-14 NOTE — Progress Notes (Signed)
UR chart review completed.  

## 2013-03-14 NOTE — Anesthesia Postprocedure Evaluation (Signed)
Anesthesia Post Note  Patient: Ana Mejia  Procedure(s) Performed: * No procedures listed *  Anesthesia type: Epidural  Patient location: Mother/Baby  Post pain: Pain level controlled  Post assessment: Post-op Vital signs reviewed  Last Vitals:  Filed Vitals:   03/14/13 0640  BP: 101/69  Pulse: 76  Temp: 36.8 C  Resp: 18    Post vital signs: Reviewed  Level of consciousness: awake  Complications: No apparent anesthesia complications

## 2013-03-14 NOTE — Lactation Note (Signed)
This note was copied from the chart of Ana Mejia. Lactation Consultation Note   Initial consult with this mom and baby. Mom has semi inverted nipples, but the baby latches very well despite this. Mom has been using football hold on her left, and cross cradle on her right. i assisted mom with compressing her tissue, and mom was able to get the baby latched deeply, with both lips flanged well, strong , rhythmic suckles. Mom reports hearing intermittent swallows. Baby is 16 hours old, and seems to be cluster feeding already. Baby and Me book pages on breast feeding reviewed with mom. Lactation folder reivewed with mom. Mom knows to call for questions/concerns  Patient Name: Ana Mejia [id number] Date: 03/14/2013 Reason for consult: Initial assessment   Maternal Data Formula Feeding for Exclusion: No Infant to breast within first hour of birth: Yes Has patient been taught Hand Expression?: No Does the patient have breastfeeding experience prior to this delivery?: Yes  Feeding Feeding Type: Breast Milk Feeding method: Breast Length of feed: 20 min  LATCH Score/Interventions Latch: Grasps breast easily, tongue down, lips flanged, rhythmical sucking.  Audible Swallowing: A few with stimulation Intervention(s): Skin to skin  Type of Nipple: Inverted (everted some already from baby breast feeding) Intervention(s): Hand pump (mom manually everts prior to latch)  Comfort (Breast/Nipple): Soft / non-tender     Hold (Positioning): Assistance needed to correctly position infant at breast and maintain latch. Intervention(s): Breastfeeding basics reviewed;Support Pillows;Position options;Skin to skin  LATCH Score: 6  Lactation Tools Discussed/Used     Consult Status Consult Status: Follow-up Date: 03/15/13 Follow-up type: In-patient    Alfred Levins 03/14/2013, 1:46 PM

## 2013-03-14 NOTE — Progress Notes (Signed)
Received call from Nursing staff regarding Rhogam administration. Patient was give Rhogam on 6/16 (at ~11 am per pharmacy) and delivered on 6/18 @ 8:47 PM. Discussed with pharmacy.  Patient received dose within 72 hours of delivery and does not need additional dose in postpartum period.

## 2013-03-15 ENCOUNTER — Encounter: Payer: Self-pay | Admitting: *Deleted

## 2013-03-15 LAB — TYPE AND SCREEN
ABO/RH(D): B NEG
Antibody Screen: POSITIVE
Unit division: 0

## 2013-03-15 MED ORDER — IBUPROFEN 600 MG PO TABS: 600.0000 mg | ORAL_TABLET | Freq: Four times a day (QID) | ORAL | Status: DC

## 2013-03-15 NOTE — Discharge Summary (Signed)
Obstetric Discharge Summary Reason for Admission: induction of labor Prenatal Procedures: none Intrapartum Procedures: spontaneous vaginal delivery Postpartum Procedures: none Complications-Operative and Postpartum: none Hemoglobin  Date Value Range Status  03/13/2013 10.8* 12.0 - 15.0 g/dL Final     HCT  Date Value Range Status  03/13/2013 32.5* 36.0 - 46.0 % Final    Physical Exam:  General: alert, cooperative and no distress Lochia: appropriate Uterine Fundus: firm Incision: NA DVT Evaluation: No evidence of DVT seen on physical exam. Negative Homan's sign. No cords or calf tenderness. No significant calf/ankle edema.  Discharge Diagnoses: Term Pregnancy-delivered  Discharge Information: Date: 03/15/2013 Activity: unrestricted Diet: routine Medications: PNV and Ibuprofen Condition: stable Instructions: refer to practice specific booklet Discharge to: home   Newborn Data: Live born female  Birth Weight: 7 lb 7.6 oz (3390 g) APGAR: 8, 9  Home with mother.  Ardis Hughs 03/15/2013, 7:54 AM  Evaluation and management procedures were performed by PA-S under my supervision/collaboration. Chart reviewed, patient examined by me and I agree with management and plan. Discharge home after Rhophylactic. Had little PNC> F/U GYN clinic for preop interval BTL.   Danae Orleans, CNM 03/15/2013 9:54 AM

## 2013-03-15 NOTE — Progress Notes (Signed)
Pt's rubella status is non-immune. Pt refuses MMR vaccine. Explained benefits of vaccine.
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
## 2013-03-16 ENCOUNTER — Inpatient Hospital Stay (HOSPITAL_COMMUNITY): Admit: 2013-03-16 | Payer: Medicaid Other

## 2013-03-16 NOTE — Discharge Summary (Signed)
Agree with above note.  LEGGETT,KELLY H. 03/16/2013 5:18 AM

## 2013-03-17 LAB — RH IG WORKUP (INCLUDES ABO/RH)
Fetal Screen: NEGATIVE
Unit division: 0

## 2013-03-18 NOTE — H&P (Signed)
Attestation of Attending Supervision of Advanced Practitioner (CNM/NP): Evaluation and management procedures were performed by the Advanced Practitioner under my supervision and collaboration.  I have reviewed the Advanced Practitioner's note and chart, and I agree with the management and plan.  Latunya Kissick 03/18/2013 11:28 AM   

## 2013-07-31 ENCOUNTER — Encounter (HOSPITAL_COMMUNITY): Payer: Self-pay | Admitting: *Deleted

## 2013-07-31 ENCOUNTER — Inpatient Hospital Stay (HOSPITAL_COMMUNITY): Payer: Self-pay

## 2013-07-31 ENCOUNTER — Inpatient Hospital Stay (HOSPITAL_COMMUNITY)
Admission: AD | Admit: 2013-07-31 | Discharge: 2013-07-31 | Disposition: A | Discharge status: Home / Self Care | Payer: Self-pay | Source: Ambulatory Visit | Attending: Obstetrics & Gynecology | Admitting: Obstetrics & Gynecology

## 2013-07-31 DIAGNOSIS — R51 Headache: Secondary | ICD-10-CM | POA: Insufficient documentation

## 2013-07-31 DIAGNOSIS — O26899 Other specified pregnancy related conditions, unspecified trimester: Secondary | ICD-10-CM

## 2013-07-31 DIAGNOSIS — O99891 Other specified diseases and conditions complicating pregnancy: Secondary | ICD-10-CM | POA: Insufficient documentation

## 2013-07-31 DIAGNOSIS — Z87891 Personal history of nicotine dependence: Secondary | ICD-10-CM | POA: Insufficient documentation

## 2013-07-31 DIAGNOSIS — M545 Low back pain, unspecified: Secondary | ICD-10-CM | POA: Insufficient documentation

## 2013-07-31 DIAGNOSIS — O210 Mild hyperemesis gravidarum: Secondary | ICD-10-CM | POA: Insufficient documentation

## 2013-07-31 DIAGNOSIS — O9989 Other specified diseases and conditions complicating pregnancy, childbirth and the puerperium: Secondary | ICD-10-CM

## 2013-07-31 DIAGNOSIS — R109 Unspecified abdominal pain: Secondary | ICD-10-CM | POA: Insufficient documentation

## 2013-07-31 LAB — URINALYSIS, ROUTINE W REFLEX MICROSCOPIC
Bilirubin Urine: NEGATIVE
Hgb urine dipstick: NEGATIVE
Nitrite: NEGATIVE
Protein, ur: NEGATIVE mg/dL
Urobilinogen, UA: 0.2 mg/dL (ref 0.0–1.0)

## 2013-07-31 LAB — CBC
Hemoglobin: 13.5 g/dL (ref 12.0–15.0)
Platelets: 250 10*3/uL (ref 150–400)
RBC: 4.47 MIL/uL (ref 3.87–5.11)
WBC: 10 10*3/uL (ref 4.0–10.5)

## 2013-07-31 LAB — WET PREP, GENITAL

## 2013-07-31 LAB — HCG, QUANTITATIVE, PREGNANCY: hCG, Beta Chain, Quant, S: 84492 m[IU]/mL — ABNORMAL HIGH (ref <5)

## 2013-07-31 LAB — POCT PREGNANCY, URINE: Preg Test, Ur: POSITIVE — AB

## 2013-07-31 MED ORDER — PROMETHAZINE HCL 25 MG PO TABS: 25.0000 mg | ORAL_TABLET | Freq: Four times a day (QID) | ORAL | Status: DC | PRN

## 2013-07-31 MED ORDER — ONDANSETRON HCL 4 MG PO TABS: 4.0000 mg | ORAL_TABLET | Freq: Four times a day (QID) | ORAL | Status: DC

## 2013-07-31 NOTE — MAU Provider Note (Signed)
History     CSN: [id number]  Arrival date and time: 07/31/13 1600   First Provider Initiated Contact with Patient 07/31/13 1703      No chief complaint on file.  HPI Ms. Ana Mejia is a 22 y.o. [phone] at [redacted]w[redacted]d who presents to MAU today with complaint of lower abdominal pain, N/V and headache. The patient had a recent pregnancy with SVD in June. She states that she was supposed to get BTL, but had to go back to work prior to surgery. She states that she has been using condoms with every intercourse recently. She is still breastfeeding and having irregular periods. She states last episode of bleeding was 06/21/13. She states that she has been having moderate to severe lower abdominal cramping and low back pain. She has had frequent N/V x days. She has also had frequent headaches recently which she attributes to caffeine withdrawal. She is having a small amount of thin, white, odorless vaginal discharge. She denies vaginal bleeding or fever. She has occasional dizziness with change of position.   OB History   Grav Para Term Preterm Abortions TAB SAB Ect Mult Living   4 3 3       3       Past Medical History  Diagnosis Date  . Headache(784.0)   . PTSD (post-traumatic stress disorder)   . Infection     UTI  . Depression     ppd after first (financial stress)    Past Surgical History  Procedure Laterality Date  . Tonsillectomy    . Wisdom tooth extraction    . Tympanostomy tube placement      x3    Family History  Problem Relation Age of Onset  . Hypertension Mother   . Autism Sister   . Thyroid disease Sister   . Hypertension Maternal Grandmother   . Peripheral vascular disease Maternal Grandmother   . Diabetes Maternal Grandmother   . Cancer Maternal Grandmother     breast  . Peripheral vascular disease Maternal Grandfather   . Heart disease Paternal Grandfather     History  Substance Use Topics  . Smoking status: Former Smoker -- 1.00 packs/day    Quit date:  08/01/2011  . Smokeless tobacco: Never Used  . Alcohol Use: No    Allergies:  Allergies  Allergen Reactions  . Carrot Flavor [Flavoring Agent] Anaphylaxis    Prescriptions prior to admission  Medication Sig Dispense Refill  . acetaminophen (TYLENOL) 325 MG tablet Take 650 mg by mouth every 6 (six) hours as needed.      . Prenatal Vit-Min-FA-Fish Oil ([employer] PRENATAL GUMMY PO) Take 2 tablets by mouth daily.      . [DISCONTINUED] ibuprofen (ADVIL,MOTRIN) 600 MG tablet Take 1 tablet (600 mg total) by mouth every 6 (six) hours.  30 tablet  0    Review of Systems  Constitutional: Negative for fever and malaise/fatigue.  Gastrointestinal: Positive for nausea, vomiting and abdominal pain. Negative for diarrhea and constipation.  Genitourinary: Negative for dysuria, urgency and frequency.       Neg - vaginal bleeding + vaginal discharge  Neurological: Positive for dizziness. Negative for loss of consciousness.   Physical Exam   Blood pressure 120/88, pulse 88, temperature 98.7 F (37.1 C), temperature source Oral, resp. rate 20, height 5\' 4"  (1.626 m), weight 211 lb (95.709 kg), last menstrual period 06/21/2013.  Physical Exam  Constitutional: She is oriented to person, place, and time. She appears well-developed and well-nourished. No distress.  HENT:  Head: Normocephalic and atraumatic.  Cardiovascular: Normal rate, regular rhythm and normal heart sounds.   Respiratory: Effort normal and breath sounds normal. No respiratory distress.  GI: Soft. Bowel sounds are normal. She exhibits no distension and no mass. There is tenderness (moderate tenderness to palpation of the lower abdomen at midline). There is no rebound and no guarding.  Genitourinary: Uterus is not enlarged (exam limited by maternal body habitus) and not tender. Cervix exhibits no motion tenderness, no discharge and no friability. Right adnexum displays tenderness. Right adnexum displays no mass. Left adnexum displays  tenderness. Left adnexum displays no mass. No bleeding around the vagina. Vaginal discharge (small amount of thin, white discharge noted) found.  Neurological: She is alert and oriented to person, place, and time.  Skin: Skin is warm and dry. No erythema.  Psychiatric: She has a normal mood and affect.   Results for orders placed during the hospital encounter of 07/31/13 (from the past 24 hour(s))  URINALYSIS, ROUTINE W REFLEX MICROSCOPIC     Status: Abnormal   Collection Time    07/31/13  4:45 PM      Result Value Range   Color, Urine YELLOW  YELLOW   APPearance HAZY (*) CLEAR   Specific Gravity, Urine 1.025  1.005 - 1.030   pH 6.0  5.0 - 8.0   Glucose, UA NEGATIVE  NEGATIVE mg/dL   Hgb urine dipstick NEGATIVE  NEGATIVE   Bilirubin Urine NEGATIVE  NEGATIVE   Ketones, ur NEGATIVE  NEGATIVE mg/dL   Protein, ur NEGATIVE  NEGATIVE mg/dL   Urobilinogen, UA 0.2  0.0 - 1.0 mg/dL   Nitrite NEGATIVE  NEGATIVE   Leukocytes, UA NEGATIVE  NEGATIVE  POCT PREGNANCY, URINE     Status: Abnormal   Collection Time    07/31/13  4:49 PM      Result Value Range   Preg Test, Ur POSITIVE (*) NEGATIVE  WET PREP, GENITAL     Status: Abnormal   Collection Time    07/31/13  5:18 PM      Result Value Range   Yeast Wet Prep HPF POC NONE SEEN  NONE SEEN   Trich, Wet Prep NONE SEEN  NONE SEEN   Clue Cells Wet Prep HPF POC NONE SEEN  NONE SEEN   WBC, Wet Prep HPF POC MODERATE (*) NONE SEEN  CBC     Status: None   Collection Time    07/31/13  5:35 PM      Result Value Range   WBC 10.0  4.0 - 10.5 K/uL   RBC 4.47  3.87 - 5.11 MIL/uL   Hemoglobin 13.5  12.0 - 15.0 g/dL   HCT 16.1  09.6 - 04.5 %   MCV 85.7  78.0 - 100.0 fL   MCH 30.2  26.0 - 34.0 pg   MCHC 35.2  30.0 - 36.0 g/dL   RDW 40.9  81.1 - 91.4 %   Platelets 250  150 - 400 K/uL  HCG, QUANTITATIVE, PREGNANCY     Status: Abnormal   Collection Time    07/31/13  5:35 PM      Result Value Range   hCG, Beta Chain, Quant, S [id number] (*) <5 mIU/mL    US Ob Comp Less 14 Wks  07/31/2013   CLINICAL DATA:  22 year old female with lower abdominal pain. Intrauterine pregnancy of 5 weeks 5 days by last menstrual period.  EXAM: OBSTETRIC <14 WK ULTRASOUND  TECHNIQUE: Transabdominal ultrasound was performed for evaluation of the gestation  as well as the maternal uterus and adnexal regions.  COMPARISON:  None.  FINDINGS: Yolk sac:  Present  Embryo:  Present  Cardiac Activity: Present  Heart Rate: 153 bpm  CRL:  19.2 mm   8 w 4d                  [place] EDC: March 08, 2014  Maternal uterus/adnexae: Grossly unremarkable. No significant free fluid.  IMPRESSION: Live intrauterine gestation with a gestational age of [redacted] weeks 4 days by crown-rump length.   Electronically Signed   By: Annia Belt M.D.   On: 07/31/2013 18:10    MAU Course  Procedures None  MDM +UPT UA, Wet prep, GC/Chlamydia, CBC, quant hCG and [place] today  Assessment and Plan  A: SIUP at [redacted]w[redacted]d Abdominal pain in pregnancy, antepartum Nausea and vomiting in pregnancy prior to [redacted] weeks gestation  P: Discharge home Rx for Phenergan and Zofran sent to patient's pharmacy Patient advised to increase PO hydration as tolerated and gradually reduce caffeine intake First trimester warning signs reviewed Patient referred to [facility] clinic for prenatal care Patient given pregnancy confirmation letter and Medicaid assistance information Patient may return to MAU as needed or if her condition were to change or worsen  Freddi Starr, PA-C  07/31/2013, 6:39 PM

## 2013-07-31 NOTE — MAU Note (Signed)
Patient states after last delivery, she signed papers to have BTL and did not take postpartum rhophylac. States she ended up having to go back to work and did not have BTL. Unhappy with + UPT and another baby on the way. Concerned about not taking Rhophylac.

## 2013-07-31 NOTE — MAU Note (Addendum)
Had a baby in June, hasn't been feeling good. Thinks might be preg again. Can't keep anything down. Head hurts so bad, has been trying to cut back on caffeine.

## 2013-08-01 ENCOUNTER — Other Ambulatory Visit: Payer: Self-pay

## 2013-08-01 LAB — GC/CHLAMYDIA PROBE AMP: CT Probe RNA: NEGATIVE

## 2013-08-27 ENCOUNTER — Encounter (HOSPITAL_COMMUNITY): Payer: Self-pay | Admitting: *Deleted

## 2013-08-27 ENCOUNTER — Inpatient Hospital Stay (HOSPITAL_COMMUNITY)
Admission: AD | Admit: 2013-08-27 | Discharge: 2013-08-27 | Disposition: A | Discharge status: Home / Self Care | Payer: No Typology Code available for payment source | Source: Ambulatory Visit | Attending: Obstetrics & Gynecology | Admitting: Obstetrics & Gynecology

## 2013-08-27 ENCOUNTER — Encounter: Payer: Medicaid Other | Admitting: Family Medicine

## 2013-08-27 ENCOUNTER — Inpatient Hospital Stay (HOSPITAL_COMMUNITY): Payer: No Typology Code available for payment source

## 2013-08-27 DIAGNOSIS — M25519 Pain in unspecified shoulder: Secondary | ICD-10-CM

## 2013-08-27 DIAGNOSIS — Y92410 Unspecified street and highway as the place of occurrence of the external cause: Secondary | ICD-10-CM | POA: Insufficient documentation

## 2013-08-27 DIAGNOSIS — O039 Complete or unspecified spontaneous abortion without complication: Secondary | ICD-10-CM

## 2013-08-27 HISTORY — DX: Anemia, unspecified: D64.9

## 2013-08-27 MED ORDER — ACETAMINOPHEN 500 MG PO TABS
500.0000 mg | ORAL_TABLET | Freq: Once | ORAL | Status: AC
  Administered 2013-08-27: 500 mg via ORAL
  Filled 2013-08-27: qty 1
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
## 2013-08-27 NOTE — MAU Note (Signed)
See triage note.
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
## 2013-09-06 ENCOUNTER — Encounter (HOSPITAL_COMMUNITY): Payer: Self-pay | Admitting: Emergency Medicine

## 2013-09-06 ENCOUNTER — Emergency Department (HOSPITAL_COMMUNITY)
Admission: EM | Admit: 2013-09-06 | Discharge: 2013-09-06 | Disposition: A | Discharge status: Home / Self Care | Payer: No Typology Code available for payment source | Attending: Emergency Medicine | Admitting: Emergency Medicine

## 2013-09-06 ENCOUNTER — Emergency Department (HOSPITAL_COMMUNITY): Payer: No Typology Code available for payment source

## 2013-09-06 DIAGNOSIS — M546 Pain in thoracic spine: Secondary | ICD-10-CM | POA: Insufficient documentation

## 2013-09-06 DIAGNOSIS — E669 Obesity, unspecified: Secondary | ICD-10-CM | POA: Insufficient documentation

## 2013-09-06 DIAGNOSIS — Z87891 Personal history of nicotine dependence: Secondary | ICD-10-CM | POA: Insufficient documentation

## 2013-09-06 DIAGNOSIS — Z8659 Personal history of other mental and behavioral disorders: Secondary | ICD-10-CM | POA: Insufficient documentation

## 2013-09-06 DIAGNOSIS — S46912S Strain of unspecified muscle, fascia and tendon at shoulder and upper arm level, left arm, sequela: Secondary | ICD-10-CM

## 2013-09-06 DIAGNOSIS — Z862 Personal history of diseases of the blood and blood-forming organs and certain disorders involving the immune mechanism: Secondary | ICD-10-CM | POA: Insufficient documentation

## 2013-09-06 DIAGNOSIS — M25519 Pain in unspecified shoulder: Secondary | ICD-10-CM | POA: Insufficient documentation

## 2013-09-06 DIAGNOSIS — S76012S Strain of muscle, fascia and tendon of left hip, sequela: Secondary | ICD-10-CM

## 2013-09-06 DIAGNOSIS — M25559 Pain in unspecified hip: Secondary | ICD-10-CM | POA: Insufficient documentation

## 2013-09-06 DIAGNOSIS — M542 Cervicalgia: Secondary | ICD-10-CM | POA: Insufficient documentation

## 2013-09-06 DIAGNOSIS — G8911 Acute pain due to trauma: Secondary | ICD-10-CM | POA: Insufficient documentation

## 2013-09-06 DIAGNOSIS — Z87440 Personal history of urinary (tract) infections: Secondary | ICD-10-CM | POA: Insufficient documentation

## 2013-09-06 MED ORDER — IBUPROFEN 800 MG PO TABS: 800.0000 mg | ORAL_TABLET | Freq: Three times a day (TID) | ORAL | Status: DC

## 2013-09-06 MED ORDER — HYDROCODONE-ACETAMINOPHEN 5-325 MG PO TABS: 1.0000 | ORAL_TABLET | ORAL | Status: DC | PRN

## 2013-09-06 MED ORDER — METHOCARBAMOL 500 MG PO TABS: 500.0000 mg | ORAL_TABLET | Freq: Two times a day (BID) | ORAL | Status: DC
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
## 2013-09-06 NOTE — Progress Notes (Signed)
[facility] provided pt with a list of primary care resources.  °

## 2013-09-06 NOTE — ED Provider Notes (Signed)
Medical screening examination/treatment/procedure(s) were performed by non-physician practitioner and as supervising physician I was immediately available for consultation/collaboration.  EKG Interpretation   None         Jovaughn Wojtaszek M Shyanna Klingel, MD 09/06/13 2042 

## 2013-11-04 ENCOUNTER — Emergency Department (HOSPITAL_COMMUNITY)
Admission: EM | Admit: 2013-11-04 | Discharge: 2013-11-04 | Disposition: A | Discharge status: Home / Self Care | Payer: No Typology Code available for payment source

## 2013-11-25 ENCOUNTER — Encounter (HOSPITAL_COMMUNITY): Payer: Self-pay | Admitting: Emergency Medicine

## 2013-11-25 ENCOUNTER — Emergency Department (HOSPITAL_COMMUNITY)
Admission: EM | Admit: 2013-11-25 | Discharge: 2013-11-25 | Disposition: A | Discharge status: Home / Self Care | Payer: No Typology Code available for payment source | Attending: Emergency Medicine | Admitting: Emergency Medicine

## 2013-11-25 DIAGNOSIS — J329 Chronic sinusitis, unspecified: Secondary | ICD-10-CM

## 2013-11-25 DIAGNOSIS — Z87891 Personal history of nicotine dependence: Secondary | ICD-10-CM | POA: Insufficient documentation

## 2013-11-25 DIAGNOSIS — Z87440 Personal history of urinary (tract) infections: Secondary | ICD-10-CM | POA: Insufficient documentation

## 2013-11-25 DIAGNOSIS — Z79899 Other long term (current) drug therapy: Secondary | ICD-10-CM | POA: Insufficient documentation

## 2013-11-25 DIAGNOSIS — J029 Acute pharyngitis, unspecified: Secondary | ICD-10-CM | POA: Insufficient documentation

## 2013-11-25 DIAGNOSIS — Z8659 Personal history of other mental and behavioral disorders: Secondary | ICD-10-CM | POA: Insufficient documentation

## 2013-11-25 DIAGNOSIS — Z862 Personal history of diseases of the blood and blood-forming organs and certain disorders involving the immune mechanism: Secondary | ICD-10-CM | POA: Insufficient documentation

## 2013-11-25 MED ORDER — AMOXICILLIN 500 MG PO CAPS: 500.0000 mg | ORAL_CAPSULE | Freq: Three times a day (TID) | ORAL | Status: AC

## 2013-11-25 MED ORDER — GUAIFENESIN ER 600 MG PO TB12: 600.0000 mg | ORAL_TABLET | Freq: Two times a day (BID) | ORAL | Status: AC

## 2013-11-25 NOTE — ED Provider Notes (Signed)
Medical screening examination/treatment/procedure(s) were performed by non-physician practitioner and as supervising physician I was immediately available for consultation/collaboration.    Amman Bartel R Nigel Wessman, MD 11/25/13 0500 

## 2013-11-25 NOTE — Discharge Instructions (Signed)
Please use the medications prescribed to help with your sinusitis infection and congestion. Followup with a primary care provider for continued evaluation and treatment.    Sinusitis Sinusitis is redness, soreness, and swelling (inflammation) of the paranasal sinuses. Paranasal sinuses are air pockets within the bones of your face (beneath the eyes, the middle of the forehead, or above the eyes). In healthy paranasal sinuses, mucus is able to drain out, and air is able to circulate through them by way of your nose. However, when your paranasal sinuses are inflamed, mucus and air can become trapped. This can allow bacteria and other germs to grow and cause infection. Sinusitis can develop quickly and last only a short time (acute) or continue over a long period (chronic). Sinusitis that lasts for more than 12 weeks is considered chronic.  CAUSES  Causes of sinusitis include:  Allergies.  Structural abnormalities, such as displacement of the cartilage that separates your nostrils (deviated septum), which can decrease the air flow through your nose and sinuses and affect sinus drainage.  Functional abnormalities, such as when the small hairs (cilia) that line your sinuses and help remove mucus do not work properly or are not present. SYMPTOMS  Symptoms of acute and chronic sinusitis are the same. The primary symptoms are pain and pressure around the affected sinuses. Other symptoms include:  Upper toothache.  Earache.  Headache.  Bad breath.  Decreased sense of smell and taste.  A cough, which worsens when you are lying flat.  Fatigue.  Fever.  Thick drainage from your nose, which often is green and may contain pus (purulent).  Swelling and warmth over the affected sinuses. DIAGNOSIS  Your caregiver will perform a physical exam. During the exam, your caregiver may:  Look in your nose for signs of abnormal growths in your nostrils (nasal polyps).  Tap over the affected sinus to  check for signs of infection.  View the inside of your sinuses (endoscopy) with a special imaging device with a light attached (endoscope), which is inserted into your sinuses. If your caregiver suspects that you have chronic sinusitis, one or more of the following tests may be recommended:  Allergy tests.  Nasal culture A sample of mucus is taken from your nose and sent to a lab and screened for bacteria.  Nasal cytology A sample of mucus is taken from your nose and examined by your caregiver to determine if your sinusitis is related to an allergy. TREATMENT  Most cases of acute sinusitis are related to a viral infection and will resolve on their own within 10 days. Sometimes medicines are prescribed to help relieve symptoms (pain medicine, decongestants, nasal steroid sprays, or saline sprays).  However, for sinusitis related to a bacterial infection, your caregiver will prescribe antibiotic medicines. These are medicines that will help kill the bacteria causing the infection.  Rarely, sinusitis is caused by a fungal infection. In theses cases, your caregiver will prescribe antifungal medicine. For some cases of chronic sinusitis, surgery is needed. Generally, these are cases in which sinusitis recurs more than 3 times per year, despite other treatments. HOME CARE INSTRUCTIONS   Drink plenty of water. Water helps thin the mucus so your sinuses can drain more easily.  Use a humidifier.  Inhale steam 3 to 4 times a day (for example, sit in the bathroom with the shower running).  Apply a warm, moist washcloth to your face 3 to 4 times a day, or as directed by your caregiver.  Use saline nasal sprays  to help moisten and clean your sinuses.  Take over-the-counter or prescription medicines for pain, discomfort, or fever only as directed by your caregiver. SEEK IMMEDIATE MEDICAL CARE IF:  You have increasing pain or severe headaches.  You have nausea, vomiting, or drowsiness.  You have  swelling around your face.  You have vision problems.  You have a stiff neck.  You have difficulty breathing. MAKE SURE YOU:   Understand these instructions.  Will watch your condition.  Will get help right away if you are not doing well or get worse. Document Released: 09/12/2005 Document Revised: 12/05/2011 Document Reviewed: 09/27/2011 [facility] Patient Information 2014 [place], [place].

## 2013-11-25 NOTE — ED Provider Notes (Signed)
CSN: [id number]     Arrival date & time 11/25/13  0334 History   First MD Initiated Contact with Patient 11/25/13 [phone]     Chief Complaint  Patient presents with  . URI   HPI  History provided by the patient. Patient is a 23 year old female with history of seasonal allergies who presents with complaints of persistent sinus congestion and pressure. Patient states she's had problems with sinus congestion and rhinorrhea for the past several months. She states symptoms first began last November and December after Thanksgiving. She has had some up-and-downs of symptoms but states the especially been worse recently. She has been trying Sudafed regularly as well as Afrin occasionally and nasal saline rinses. She has not had any improvement of her symptoms. She complains of pain and pressure across bilateral 4/2 and maxillary areas. She also reports feeling drainage down the back of her throat with occasional cough. She has very poor air movement throughout left nostril. She also reports thick mucus drainage from her nose with occasional blood. She also reports some subjective fevers and chills over the course of her illness. Denies any other aggravating or alleviating factors. No other associated symptoms.    Past Medical History  Diagnosis Date  . Headache(784.0)   . PTSD (post-traumatic stress disorder)   . Infection     UTI  . Anemia   . 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 Tonsillectomy    . Wisdom tooth extraction    . Tympanostomy tube placement      x3   Family History  Problem Relation Age of Onset  . Hypertension Mother   . Autism Sister   . Thyroid disease Sister   . Hypertension Maternal Grandmother   . Peripheral vascular disease Maternal Grandmother   . Diabetes Maternal Grandmother   . Cancer Maternal Grandmother     breast  . Peripheral vascular disease Maternal Grandfather   . Heart disease Paternal  Grandfather    History  Substance Use Topics  . Smoking status: Former Smoker -- 1.00 packs/day    Quit date: 08/01/2011  . Smokeless tobacco: Never Used  . Alcohol Use: No   OB History   Grav Para Term Preterm Abortions TAB SAB Ect Mult Living   4 3 3  1  1   3      Review of Systems  Constitutional: Positive for fever, chills and fatigue.  HENT: Positive for congestion, rhinorrhea, sinus pressure and sore throat.   Respiratory: Positive for cough.   Gastrointestinal: Negative for nausea, vomiting, abdominal pain, diarrhea and constipation.  All other systems reviewed and are negative.      Allergies  Carrot flavor  Home Medications   Current Outpatient Rx  Name  Route  Sig  Dispense  Refill  . norgestrel-ethinyl estradiol (LO/OVRAL,CRYSELLE) 0.3-30 MG-MCG tablet   Oral   Take 1 tablet by mouth daily.         Marland Kitchen oxymetazoline (AFRIN) 0.05 % nasal spray   Each Nare   Place 1 spray into both nostrils 2 (two) times daily as needed for congestion.         Marland Kitchen Phenylephrine-DM-GG-APAP (SUDAFED PE COLD/COUGH) 5-10-100-325 MG TABS   Oral   Take 2 tablets by mouth every 6 (six) hours as needed (cough).          BP 120/70  Pulse 82  Temp(Src) 97.6 F (36.4 C) (Oral)  Resp  18  Ht 5\' 4"  (1.626 m)  Wt 180 lb (81.647 kg)  BMI 30.88 kg/m2  SpO2 96%  LMP 11/23/2013  Breastfeeding? No Physical Exam  Nursing note and vitals reviewed. Constitutional: She is oriented to person, place, and time. She appears well-developed and well-nourished. No distress.  HENT:  Head: Normocephalic.  Nose: Mucosal edema present. Right sinus exhibits maxillary sinus tenderness and frontal sinus tenderness. Left sinus exhibits maxillary sinus tenderness and frontal sinus tenderness.  Poor air movement to the left nostril.  Cardiovascular: Normal rate and regular rhythm.   Pulmonary/Chest: Effort normal and breath sounds normal. No respiratory distress. She has no wheezes. She has no  rales.  Neurological: She is alert and oriented to person, place, and time.  Skin: Skin is warm and dry. No rash noted.  Psychiatric: She has a normal mood and affect. Her behavior is normal.    ED Course  Procedures   DIAGNOSTIC STUDIES: Oxygen Saturation is 96% on room air.    COORDINATION OF CARE:  Nursing notes reviewed. Vital signs reviewed. Initial pt interview and examination performed.   4:46 AM- Pt seen and evaluated.  Pt well appearing in no acute distress.  Pt does not appear severely ill or toxic.  Discussed treatment plan with pt at bedside, which includes amoxicillin. Pt agrees with plan.        MDM   Final diagnoses:  Sinusitis       Angus Sellereter S Mazy Culton, PA-C 11/25/13 [phone]

## 2013-11-25 NOTE — ED Notes (Signed)
Pt c/o sinus congestion, facial pain, sore throat, cough s/s intermittent since Thanksgiving. Pt states she awoke tonight feeling shob d/t amount of mucous. NAD
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
## 2014-07-28 ENCOUNTER — Encounter (HOSPITAL_COMMUNITY): Payer: Self-pay | Admitting: Emergency Medicine

## 2019-04-09 ENCOUNTER — Telehealth: Payer: Self-pay | Admitting: Family

## 2019-04-09 DIAGNOSIS — R112 Nausea with vomiting, unspecified: Secondary | ICD-10-CM

## 2019-04-09 MED ORDER — PROMETHAZINE HCL 25 MG PO TABS: 25.0000 mg | ORAL_TABLET | Freq: Three times a day (TID) | ORAL | 0 refills | Status: AC | PRN

## 2019-04-09 NOTE — Progress Notes (Signed)
We are sorry that you are not feeling well. Here is how we plan to help!  Unfortunately, we do not treat migraines through e-visit. However, we can help with the nausea and vomiting.    Based on what you have shared with me it looks like you have a Virus that is irritating your GI tract.  Vomiting is the forceful emptying of a portion of the stomach's content through the mouth.  Although nausea and vomiting can make you feel miserable, it's important to remember that these are not diseases, but rather symptoms of an underlying illness.  When we treat short term symptoms, we always caution that any symptoms that persist should be fully evaluated in a medical office.  I have prescribed a medication that will help alleviate your symptoms and allow you to stay hydrated:  Promethazine 25 mg take 1 tablet twice daily  HOME CARE:  Drink clear liquids.  This is very important! Dehydration (the lack of fluid) can lead to a serious complication.  Start off with 1 tablespoon every 5 minutes for 8 hours.  You may begin eating bland foods after 8 hours without vomiting.  Start with saltine crackers, white bread, rice, mashed potatoes, applesauce.  After 48 hours on a bland diet, you may resume a normal diet.  Try to go to sleep.  Sleep often empties the stomach and relieves the need to vomit.  GET HELP RIGHT AWAY IF:   Your symptoms do not improve or worsen within 2 days after treatment.  You have a fever for over 3 days.  You cannot keep down fluids after trying the medication.  MAKE SURE YOU:   Understand these instructions.  Will watch your condition.  Will get help right away if you are not doing well or get worse.   Thank you for choosing an e-visit. Your e-visit answers were reviewed by a board certified advanced clinical practitioner to complete your personal care plan. Depending upon the condition, your plan could have included both over the counter or prescription  medications. Please review your pharmacy choice. Be sure that the pharmacy you have chosen is open so that you can pick up your prescription now.  If there is a problem you may message your provider in [place] to have the prescription routed to another pharmacy. Your safety is important to [place]. If you have drug allergies check your prescription carefully.  For the next 24 hours, you can use MyChart to ask questions about today's visit, request a non-urgent call back, or ask for a work or school excuse from your e-visit provider. You will get an e-mail in the next two days asking about your experience. I hope that your e-visit has been valuable and will speed your recovery.  Greater than 5 minutes, yet less than 10 minutes of time have been spent researching, coordinating, and implementing care for this patient today.  Thank you for the details you included in the comment boxes. Those details are very helpful in determining the best course of treatment for you and help [place] to provide the best care.

## 2019-05-07 ENCOUNTER — Telehealth: Payer: Self-pay | Admitting: Family

## 2019-05-07 DIAGNOSIS — J019 Acute sinusitis, unspecified: Secondary | ICD-10-CM

## 2019-05-07 DIAGNOSIS — B9689 Other specified bacterial agents as the cause of diseases classified elsewhere: Secondary | ICD-10-CM

## 2019-05-07 MED ORDER — AMOXICILLIN-POT CLAVULANATE 875-125 MG PO TABS: 1.0000 | ORAL_TABLET | Freq: Two times a day (BID) | ORAL | 0 refills | Status: AC

## 2019-05-07 NOTE — Progress Notes (Signed)

## 2019-06-11 ENCOUNTER — Telehealth: Payer: Self-pay | Admitting: Nurse Practitioner

## 2019-06-11 DIAGNOSIS — J029 Acute pharyngitis, unspecified: Secondary | ICD-10-CM

## 2019-06-11 NOTE — Progress Notes (Signed)
